# Patient Record
Sex: Female | Born: 1970 | Hispanic: Yes | Marital: Married | Smoking: Never smoker
Health system: Southern US, Community
[De-identification: ages and names within clinical notes are randomized; demographics above are authoritative.]

## PROBLEM LIST (undated history)

## (undated) ENCOUNTER — Emergency Department (HOSPITAL_COMMUNITY): Admission: EM | Payer: Self-pay | Source: Home / Self Care

## (undated) DIAGNOSIS — I10 Essential (primary) hypertension: Secondary | ICD-10-CM

## (undated) HISTORY — DX: Essential (primary) hypertension: I10

---

## 2006-02-18 ENCOUNTER — Ambulatory Visit: Payer: Self-pay | Admitting: Family Medicine

## 2006-02-18 ENCOUNTER — Encounter: Payer: Self-pay | Admitting: Family Medicine

## 2008-02-27 ENCOUNTER — Ambulatory Visit (HOSPITAL_COMMUNITY): Admission: RE | Admit: 2008-02-27 | Discharge: 2008-02-27 | Payer: Self-pay | Admitting: Specialist

## 2010-01-12 ENCOUNTER — Inpatient Hospital Stay (HOSPITAL_COMMUNITY): Admission: AD | Admit: 2010-01-12 | Discharge: 2010-01-12 | Payer: Self-pay | Admitting: Obstetrics & Gynecology

## 2011-02-02 LAB — POCT PREGNANCY, URINE: Preg Test, Ur: POSITIVE

## 2011-02-02 LAB — WET PREP, GENITAL: Clue Cells Wet Prep HPF POC: NONE SEEN

## 2011-02-02 LAB — CBC
Hemoglobin: 13 g/dL (ref 12.0–15.0)
WBC: 9.3 10*3/uL (ref 4.0–10.5)

## 2011-02-02 LAB — GC/CHLAMYDIA PROBE AMP, GENITAL: Chlamydia, DNA Probe: NEGATIVE

## 2011-02-02 LAB — HCG, QUANTITATIVE, PREGNANCY: hCG, Beta Chain, Quant, S: 42505 m[IU]/mL — ABNORMAL HIGH (ref <5)

## 2011-03-27 NOTE — Group Therapy Note (Signed)
NAME:  Courtney Daugherty, Courtney Daugherty NO.:  [id number]   MEDICAL RECORD NO.:  [id number]          PATIENT TYPE:  WOC   LOCATION:  [facility]                   FACILITY:  [facility]   PHYSICIAN:  Tinnie Gens, MD        DATE OF BIRTH:  1971-04-18   DATE OF SERVICE:                                    CLINIC NOTE   CHIEF COMPLAINT:  Yearly exam.   HISTORY OF PRESENT ILLNESS:  The patient is a 40 year old gravida 2, para 2  who is self referred for a yearly exam.  She is without significant  complaints today.  She currently has a 10-year IUD that was placed in  [place], and it has been in approximately 7 years, and she does not have  extremely painful periods.  The patient is complaining of about a 52-month  history of some lower abdominal pain.  She denies dysuria, constipation or  diarrhea.   PAST MEDICAL HISTORY:  Negative.   PAST SURGICAL HISTORY:  None.   MEDICATIONS:  None.   ALLERGIES:  None known.   OBSTETRICAL HISTORY:  She is a G2, P2.  STD x 2.   GYNECOLOGICAL HISTORY:  Menarche at age 23.  Cycles are monthly, every 28  days, lasts 8 days.  She has medium-to-light flow with no pain.  Her last  Pap was in 2005.  She has no history of an abnormal Pap.   FAMILY HISTORY:  Significant for coronary artery disease and hypertension.   SOCIAL HISTORY:  The patient does work.  She denies tobacco, alcohol or drug  use.   REVIEW OF SYSTEMS:  A 14-point review of systems was reviewed.  Please see  GYN history on the chart.  She does have some bruising and weight gain  recently.  Otherwise negative.   PHYSICAL EXAMINATION:  VITAL SIGNS:  Her weight is 126.8.  Other vital signs  are as noted on the chart.  GENERAL:  She is a well-developed, well-nourished Hispanic female in no  acute distress.  ABDOMEN:  Soft, nontender, nondistended.  GU:  Normal external female genitalia.  BUS was normal.  Vagina is pink and  rugated.  The cervix is visualized and without lesion.  IUD strings  are  visible at approximately 1.0 cm in length and white in nature.  There is a  large transformation zone seen.  The uterus is retroverted, nontender.  The  adnexa are without mass or tenderness.   IMPRESSION:  Normal yearly exam.   PLAN:  1.  Pap smear today.  2.  Follow up as needed in one year for a repeat Pap.  3.  Needs yearly mammogram starting at age 70.           ______________________________  Tinnie Gens, MD     TP/MEDQ  D:  02/18/2006  T:  02/18/2006  Job:  [id number]

## 2011-10-05 ENCOUNTER — Other Ambulatory Visit (HOSPITAL_COMMUNITY): Payer: Self-pay | Admitting: Geriatric Medicine

## 2011-10-05 DIAGNOSIS — Z1231 Encounter for screening mammogram for malignant neoplasm of breast: Secondary | ICD-10-CM

## 2011-11-06 ENCOUNTER — Ambulatory Visit (HOSPITAL_COMMUNITY): Payer: Self-pay

## 2011-11-11 ENCOUNTER — Ambulatory Visit (HOSPITAL_COMMUNITY)
Admission: RE | Admit: 2011-11-11 | Discharge: 2011-11-11 | Disposition: A | Discharge status: Home / Self Care | Payer: Self-pay | Source: Ambulatory Visit | Attending: Geriatric Medicine | Admitting: Geriatric Medicine

## 2011-11-11 DIAGNOSIS — Z1231 Encounter for screening mammogram for malignant neoplasm of breast: Secondary | ICD-10-CM | POA: Insufficient documentation

## 2012-12-26 ENCOUNTER — Other Ambulatory Visit (HOSPITAL_COMMUNITY): Payer: Self-pay | Admitting: Geriatric Medicine

## 2012-12-26 DIAGNOSIS — Z1231 Encounter for screening mammogram for malignant neoplasm of breast: Secondary | ICD-10-CM

## 2013-01-05 ENCOUNTER — Ambulatory Visit (HOSPITAL_COMMUNITY)
Admission: RE | Admit: 2013-01-05 | Discharge: 2013-01-05 | Disposition: A | Discharge status: Home / Self Care | Payer: Self-pay | Source: Ambulatory Visit | Attending: Geriatric Medicine | Admitting: Geriatric Medicine

## 2013-01-05 DIAGNOSIS — Z1231 Encounter for screening mammogram for malignant neoplasm of breast: Secondary | ICD-10-CM

## 2014-01-01 ENCOUNTER — Other Ambulatory Visit (HOSPITAL_COMMUNITY): Payer: Self-pay | Admitting: Geriatric Medicine

## 2014-01-01 DIAGNOSIS — Z1231 Encounter for screening mammogram for malignant neoplasm of breast: Secondary | ICD-10-CM

## 2014-01-10 ENCOUNTER — Ambulatory Visit (HOSPITAL_COMMUNITY)
Admission: RE | Admit: 2014-01-10 | Discharge: 2014-01-10 | Disposition: A | Discharge status: Home / Self Care | Payer: Self-pay | Source: Ambulatory Visit | Attending: Geriatric Medicine | Admitting: Geriatric Medicine

## 2014-01-10 DIAGNOSIS — Z1231 Encounter for screening mammogram for malignant neoplasm of breast: Secondary | ICD-10-CM

## 2015-01-03 ENCOUNTER — Other Ambulatory Visit (HOSPITAL_COMMUNITY): Payer: Self-pay | Admitting: Geriatric Medicine

## 2015-01-03 DIAGNOSIS — Z1231 Encounter for screening mammogram for malignant neoplasm of breast: Secondary | ICD-10-CM

## 2015-01-16 ENCOUNTER — Ambulatory Visit (HOSPITAL_COMMUNITY): Payer: Self-pay

## 2015-01-30 ENCOUNTER — Ambulatory Visit (HOSPITAL_COMMUNITY)
Admission: RE | Admit: 2015-01-30 | Discharge: 2015-01-30 | Disposition: A | Discharge status: Home / Self Care | Payer: Self-pay | Source: Ambulatory Visit | Attending: Geriatric Medicine | Admitting: Geriatric Medicine

## 2015-01-30 DIAGNOSIS — Z1231 Encounter for screening mammogram for malignant neoplasm of breast: Secondary | ICD-10-CM

## 2015-03-08 ENCOUNTER — Emergency Department (HOSPITAL_COMMUNITY)
Admission: EM | Admit: 2015-03-08 | Discharge: 2015-03-09 | Disposition: A | Discharge status: Home / Self Care | Payer: Medicaid Other | Attending: Emergency Medicine | Admitting: Emergency Medicine

## 2015-03-08 ENCOUNTER — Encounter (HOSPITAL_COMMUNITY): Payer: Self-pay | Admitting: Emergency Medicine

## 2015-03-08 ENCOUNTER — Emergency Department (HOSPITAL_COMMUNITY): Payer: Medicaid Other

## 2015-03-08 DIAGNOSIS — N10 Acute tubulo-interstitial nephritis: Secondary | ICD-10-CM | POA: Diagnosis not present

## 2015-03-08 DIAGNOSIS — R109 Unspecified abdominal pain: Secondary | ICD-10-CM | POA: Diagnosis present

## 2015-03-08 DIAGNOSIS — Z3202 Encounter for pregnancy test, result negative: Secondary | ICD-10-CM | POA: Insufficient documentation

## 2015-03-08 LAB — URINALYSIS, ROUTINE W REFLEX MICROSCOPIC
BILIRUBIN URINE: NEGATIVE
Glucose, UA: NEGATIVE mg/dL
KETONES UR: 40 mg/dL — AB
NITRITE: POSITIVE — AB
Protein, ur: 30 mg/dL — AB
SPECIFIC GRAVITY, URINE: 1.01 (ref 1.005–1.030)
Urobilinogen, UA: 1 mg/dL (ref 0.0–1.0)
pH: 6.5 (ref 5.0–8.0)

## 2015-03-08 LAB — COMPREHENSIVE METABOLIC PANEL
ALBUMIN: 3.2 g/dL — AB (ref 3.5–5.2)
ALK PHOS: 99 U/L (ref 39–117)
ALT: 18 U/L (ref 0–35)
ANION GAP: 9 (ref 5–15)
AST: 20 U/L (ref 0–37)
BILIRUBIN TOTAL: 0.7 mg/dL (ref 0.3–1.2)
BUN: 9 mg/dL (ref 6–23)
CALCIUM: 8.2 mg/dL — AB (ref 8.4–10.5)
CO2: 21 mmol/L (ref 19–32)
Chloride: 104 mmol/L (ref 96–112)
Creatinine, Ser: 0.71 mg/dL (ref 0.50–1.10)
GLUCOSE: 112 mg/dL — AB (ref 70–99)
POTASSIUM: 3.3 mmol/L — AB (ref 3.5–5.1)
SODIUM: 134 mmol/L — AB (ref 135–145)
Total Protein: 6.3 g/dL (ref 6.0–8.3)

## 2015-03-08 LAB — CBC WITH DIFFERENTIAL/PLATELET
Basophils Absolute: 0 10*3/uL (ref 0.0–0.1)
Basophils Relative: 0 % (ref 0–1)
EOS ABS: 0 10*3/uL (ref 0.0–0.7)
Eosinophils Relative: 0 % (ref 0–5)
HCT: 35.3 % — ABNORMAL LOW (ref 36.0–46.0)
Hemoglobin: 12.2 g/dL (ref 12.0–15.0)
LYMPHS PCT: 4 % — AB (ref 12–46)
Lymphs Abs: 0.9 10*3/uL (ref 0.7–4.0)
MCH: 31.6 pg (ref 26.0–34.0)
MCHC: 34.6 g/dL (ref 30.0–36.0)
MCV: 91.5 fL (ref 78.0–100.0)
MONO ABS: 2.2 10*3/uL — AB (ref 0.1–1.0)
Monocytes Relative: 11 % (ref 3–12)
NEUTROS PCT: 85 % — AB (ref 43–77)
Neutro Abs: 17.8 10*3/uL — ABNORMAL HIGH (ref 1.7–7.7)
PLATELETS: 191 10*3/uL (ref 150–400)
RBC: 3.86 MIL/uL — ABNORMAL LOW (ref 3.87–5.11)
RDW: 12.7 % (ref 11.5–15.5)
WBC: 20.9 10*3/uL — AB (ref 4.0–10.5)

## 2015-03-08 LAB — URINE MICROSCOPIC-ADD ON

## 2015-03-08 LAB — LIPASE, BLOOD: Lipase: 16 U/L (ref 11–59)

## 2015-03-08 LAB — POC URINE PREG, ED: Preg Test, Ur: NEGATIVE

## 2015-03-08 MED ORDER — ONDANSETRON HCL 4 MG/2ML IJ SOLN
4.0000 mg | Freq: Once | INTRAMUSCULAR | Status: AC
  Administered 2015-03-09: 4 mg via INTRAVENOUS
  Filled 2015-03-08: qty 2

## 2015-03-08 MED ORDER — SODIUM CHLORIDE 0.9 % IV BOLUS (SEPSIS)
1000.0000 mL | Freq: Once | INTRAVENOUS | Status: AC
  Administered 2015-03-08: 1000 mL via INTRAVENOUS

## 2015-03-08 MED ORDER — KETOROLAC TROMETHAMINE 30 MG/ML IJ SOLN
30.0000 mg | Freq: Once | INTRAMUSCULAR | Status: AC
  Administered 2015-03-08: 30 mg via INTRAVENOUS
  Filled 2015-03-08: qty 1

## 2015-03-08 MED ORDER — DEXTROSE 5 % IV SOLN
1.0000 g | Freq: Once | INTRAVENOUS | Status: AC
  Administered 2015-03-08: 1 g via INTRAVENOUS
  Filled 2015-03-08: qty 10

## 2015-03-08 MED ORDER — MORPHINE SULFATE 4 MG/ML IJ SOLN
4.0000 mg | Freq: Once | INTRAMUSCULAR | Status: AC
  Administered 2015-03-09: 4 mg via INTRAVENOUS
  Filled 2015-03-08: qty 1

## 2015-03-08 NOTE — ED Provider Notes (Signed)
CSN: [id number]     Arrival date & time 03/08/15  2138 History   First MD Initiated Contact with Patient 03/08/15 2251     Chief Complaint  Patient presents with  . Abdominal Pain  . Flank Pain  . Dysuria     (Consider location/radiation/quality/duration/timing/severity/associated sxs/prior Treatment) HPI Comments: Patient is a 44 year old female with no past medical history who presents with flank pain that started 3 days ago. The pain is located in the right flank and radiates around to her right abdomen. The pain is described as aching and severe. The pain started gradually and progressively worsened since the onset. No alleviating/aggravating factors. The patient has tried nothing for symptoms without relief. Associated symptoms include dysuria, nausea, vomiting, and diarrhea. Patient denies fever, headache, chest pain, SOB, dysuria, constipation, abnormal vaginal bleeding/discharge. No history of abdominal surgery.      History reviewed. No pertinent past medical history. History reviewed. No pertinent past surgical history. No family history on file. History  Substance Use Topics  . Smoking status: Never Smoker   . Smokeless tobacco: Not on file  . Alcohol Use: No   OB History    No data available     Review of Systems  Constitutional: Negative for fever, chills and fatigue.  HENT: Negative for trouble swallowing.   Eyes: Negative for visual disturbance.  Respiratory: Negative for shortness of breath.   Cardiovascular: Negative for chest pain and palpitations.  Gastrointestinal: Positive for vomiting and diarrhea. Negative for nausea and abdominal pain.  Genitourinary: Positive for dysuria and flank pain. Negative for difficulty urinating.  Musculoskeletal: Negative for arthralgias and neck pain.  Skin: Negative for color change.  Neurological: Negative for dizziness and weakness.  Psychiatric/Behavioral: Negative for dysphoric mood.      Allergies  Review of  patient's allergies indicates no known allergies.  Home Medications   Prior to Admission medications   Not on File   BP 116/70 mmHg  Pulse 102  Temp(Src) 99.4 F (37.4 C) (Oral)  Resp 20  Ht 5\' 3"  (1.6 m)  Wt 115 lb (52.164 kg)  BMI 20.38 kg/m2  SpO2 98%  LMP 02/14/2015 Physical Exam  Constitutional: She is oriented to person, place, and time. She appears well-developed and well-nourished. No distress.  HENT:  Head: Normocephalic and atraumatic.  Eyes: Conjunctivae and EOM are normal.  Neck: Normal range of motion.  Cardiovascular: Regular rhythm.  Exam reveals no gallop and no friction rub.   No murmur heard. tachycardic  Pulmonary/Chest: Effort normal and breath sounds normal. She has no wheezes. She has no rales. She exhibits no tenderness.  Abdominal: Soft. She exhibits no distension. There is tenderness. There is no rebound and no guarding.  Right sided abdominal tenderness to palpation. No focal tenderness or peritoneal signs.   Genitourinary:  Right CVA tenderness.   Musculoskeletal: Normal range of motion.  Neurological: She is alert and oriented to person, place, and time. Coordination normal.  Speech is goal-oriented. Moves limbs without ataxia.   Skin: Skin is warm and dry.  Psychiatric: She has a normal mood and affect. Her behavior is normal.  Nursing note and vitals reviewed.   ED Course  Procedures (including critical care time) Labs Review Labs Reviewed  CBC WITH DIFFERENTIAL/PLATELET - Abnormal; Notable for the following:    WBC 20.9 (*)    RBC 3.86 (*)    HCT 35.3 (*)    Neutrophils Relative % 85 (*)    Neutro Abs 17.8 (*)  Lymphocytes Relative 4 (*)    Monocytes Absolute 2.2 (*)    All other components within normal limits  COMPREHENSIVE METABOLIC PANEL - Abnormal; Notable for the following:    Sodium 134 (*)    Potassium 3.3 (*)    Glucose, Bld 112 (*)    Calcium 8.2 (*)    Albumin 3.2 (*)    All other components within normal limits   URINALYSIS, ROUTINE W REFLEX MICROSCOPIC - Abnormal; Notable for the following:    Color, Urine AMBER (*)    APPearance CLOUDY (*)    Hgb urine dipstick MODERATE (*)    Ketones, ur 40 (*)    Protein, ur 30 (*)    Nitrite POSITIVE (*)    Leukocytes, UA LARGE (*)    All other components within normal limits  URINE MICROSCOPIC-ADD ON - Abnormal; Notable for the following:    Squamous Epithelial / LPF MANY (*)    Bacteria, UA FEW (*)    All other components within normal limits  URINE CULTURE  LIPASE, BLOOD  POC URINE PREG, ED    Imaging Review Ct Abdomen Pelvis Wo Contrast  03/09/2015   CLINICAL DATA:  RIGHT flank pain, RIGHT abdominal and epigastric pain with dysuria. Vomiting and diarrhea beginning this week.  EXAM: CT ABDOMEN AND PELVIS WITHOUT CONTRAST  TECHNIQUE: Multidetector CT imaging of the abdomen and pelvis was performed following the standard protocol without IV contrast.  COMPARISON:  None.  FINDINGS: LUNG BASES: Included view of the lung bases are clear. The visualized heart and pericardium are unremarkable.  KIDNEYS/BLADDER: Kidneys are orthotopic, demonstrating normal size and morphology. Mild RIGHT hydronephrosis and perinephric stranding. No nephrolithiasis; limited assessment for renal masses on this nonenhanced examination. The unopacified ureters are normal in course and caliber. Urinary bladder is partially distended and unremarkable.  SOLID ORGANS: The liver, spleen, gallbladder, pancreas and adrenal glands are unremarkable for this non-contrast examination.  GASTROINTESTINAL TRACT: The stomach, small and large bowel are normal in course and caliber without inflammatory changes, the sensitivity may be decreased by lack of enteric contrast. Normal appendix.  PERITONEUM/RETROPERITONEUM: Aortoiliac vessels are normal in course and caliber. No lymphadenopathy by CT size criteria. Internal reproductive organs are normal; well-seated IUD. 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 Small amount of free fluid in the pelvis is likely physiologic. No intraperitoneal free air.  SOFT TISSUES/ OSSEOUS STRUCTURES: Nonsuspicious. Mild L5-S1 disc height loss, endplate spurring consistent with degenerative disc resulting in moderate LEFT neural foraminal narrowing.  IMPRESSION: Mild RIGHT hydronephrosis can be seen with urinary tract infection, urolithiasis.  Normal appendix.   Electronically Signed   By: Awilda Metro   On: 03/09/2015 00:21     EKG Interpretation None      MDM   Final diagnoses:  Pyelonephritis, acute    11:22 PM Labs show elevated WBC at 21 and urine infected. Patient tachycardic with a low grade temp of 99.4. Patient will have CT abdomen pelvis to rule out kidney stone.   12:48 AM Patient's vitals have improved. CT shows no kidney stone. Patient is well appearing and tolerating PO. Patient will be discharged with antibiotics, pain medication, and zofran. Patient instructed to return to the ED with worsening or concerning symptoms.    Emilia Beck, PA-C 03/09/15 8119  Marisa Severin, MD 03/09/15 [phone]

## 2015-03-08 NOTE — ED Notes (Signed)
Pt. reports right flank pain , right abdomina/epigastric pain with dysuria , emesis and diarrhea onset this week . No fever ore chills.

## 2015-03-09 MED ORDER — HYDROCODONE-ACETAMINOPHEN 5-325 MG PO TABS: 2.0000 | ORAL_TABLET | ORAL | Status: DC | PRN

## 2015-03-09 MED ORDER — SULFAMETHOXAZOLE-TRIMETHOPRIM 800-160 MG PO TABS: 1.0000 | ORAL_TABLET | Freq: Two times a day (BID) | ORAL | Status: AC

## 2015-03-09 MED ORDER — ONDANSETRON 4 MG PO TBDP
4.0000 mg | ORAL_TABLET | Freq: Three times a day (TID) | ORAL | Status: DC | PRN
Start: 2015-03-09 — End: 2021-10-10

## 2015-03-09 NOTE — Discharge Instructions (Signed)
Take Bactrim as directed until gone. Take Vicodin as needed for pain. Take zofran as needed for nausea. Refer to attached documents for more information.

## 2015-03-11 LAB — URINE CULTURE
Colony Count: >=100000
SPECIAL REQUESTS: NORMAL

## 2015-03-12 ENCOUNTER — Telehealth (HOSPITAL_BASED_OUTPATIENT_CLINIC_OR_DEPARTMENT_OTHER): Payer: Self-pay | Admitting: Emergency Medicine

## 2015-03-13 ENCOUNTER — Encounter (HOSPITAL_COMMUNITY): Payer: Self-pay | Admitting: *Deleted

## 2015-03-13 ENCOUNTER — Telehealth (HOSPITAL_BASED_OUTPATIENT_CLINIC_OR_DEPARTMENT_OTHER): Payer: Self-pay | Admitting: Emergency Medicine

## 2015-03-13 MED ORDER — NITROFURANTOIN MONOHYD MACRO 100 MG PO CAPS: 100.0000 mg | ORAL_CAPSULE | Freq: Two times a day (BID) | ORAL | Status: DC

## 2015-03-13 NOTE — ED Notes (Signed)
Per Dr Estell HarpinZammit and Denny PeonErin, [place], patient needs new RX and patient does not need to be seen.  Patient will need to follow up in a few days with PCP or [facility].

## 2015-03-13 NOTE — Telephone Encounter (Signed)
Post ED Visit - Positive Culture Follow-up: Successful Patient Follow-Up  Culture assessed and recommendations reviewed by: []  [name], Pharm.D., BCPS [x]  [name], Pharm.D., BCPS []  [name], Pharm.D., BCPS []  DanvilleMinh Pham, [address] Rainbow BoulevardPharm.D., BCPS, AAHIVP []  [name], Pharm.D., BCPS, AAHIVP []  [name], Pharm.D. []  [name], Pharm.D.  Positive urine culture E. coli  []  Patient discharged without antimicrobial prescription and treatment is now indicated [x]  Organism is resistant to prescribed ED discharge antimicrobial []  Patient with positive blood cultures  Changes discussed with ED provider:[name] PA New antibiotic prescription need to return to ED for IV antibiotics Called to n/a  Contacted patient, date 03/13/15 @ 1445   [name], [name] 03/13/2015, 2:51 PM

## 2015-03-13 NOTE — ED Notes (Signed)
Patient will be dismissed, visit not needed.

## 2015-03-13 NOTE — ED Notes (Signed)
The pt was here Friday night and diagnosed with a uti.   She is on antibiotics but they were called  And told the antibiotics she was placed on were not the right ones and she needed to come back

## 2015-03-13 NOTE — ED Provider Notes (Signed)
Pt returned back to ED as [facility] explaining her urine culture resulted, indicated she needed a change in her antibiotics. Reviewed urine culture with Dr. Estell HarpinZammit.  Pt is young and otherwise healthy. Afebrile. Do not believe pt needs IV antibiotics at this time. Will write a prescription for nitrofurantoin.  Advised to f/u in 3-4 days with PCP or return to ED if needed if symptoms not improving.   Junius Finnerrin O'Malley, PA-C 03/13/15 2156  Bethann BerkshireJoseph Zammit, MD 03/14/15 [phone]

## 2015-09-02 ENCOUNTER — Other Ambulatory Visit: Payer: Self-pay | Admitting: Neurosurgery

## 2015-09-02 DIAGNOSIS — M5126 Other intervertebral disc displacement, lumbar region: Secondary | ICD-10-CM

## 2016-01-27 ENCOUNTER — Other Ambulatory Visit: Payer: Self-pay | Admitting: Emergency Medicine

## 2016-01-27 ENCOUNTER — Other Ambulatory Visit: Payer: Self-pay | Admitting: Geriatric Medicine

## 2016-01-27 ENCOUNTER — Other Ambulatory Visit: Payer: Self-pay | Admitting: Obstetrics & Gynecology

## 2016-01-27 DIAGNOSIS — Z1231 Encounter for screening mammogram for malignant neoplasm of breast: Secondary | ICD-10-CM

## 2016-02-05 ENCOUNTER — Ambulatory Visit
Admission: RE | Admit: 2016-02-05 | Discharge: 2016-02-05 | Disposition: A | Discharge status: Home / Self Care | Payer: No Typology Code available for payment source | Source: Ambulatory Visit | Attending: Geriatric Medicine | Admitting: Geriatric Medicine

## 2016-02-05 DIAGNOSIS — Z1231 Encounter for screening mammogram for malignant neoplasm of breast: Secondary | ICD-10-CM

## 2017-01-27 ENCOUNTER — Other Ambulatory Visit: Payer: Self-pay | Admitting: Internal Medicine

## 2017-01-27 DIAGNOSIS — Z1231 Encounter for screening mammogram for malignant neoplasm of breast: Secondary | ICD-10-CM

## 2017-02-24 ENCOUNTER — Ambulatory Visit
Admission: RE | Admit: 2017-02-24 | Discharge: 2017-02-24 | Disposition: A | Discharge status: Home / Self Care | Payer: No Typology Code available for payment source | Source: Ambulatory Visit | Attending: Internal Medicine | Admitting: Internal Medicine

## 2017-02-24 DIAGNOSIS — Z1231 Encounter for screening mammogram for malignant neoplasm of breast: Secondary | ICD-10-CM

## 2018-02-15 ENCOUNTER — Other Ambulatory Visit: Payer: Self-pay | Admitting: Physician Assistant

## 2018-02-15 DIAGNOSIS — Z1231 Encounter for screening mammogram for malignant neoplasm of breast: Secondary | ICD-10-CM

## 2018-02-21 ENCOUNTER — Other Ambulatory Visit: Payer: Self-pay

## 2018-02-23 LAB — CYTOLOGY - PAP: Diagnosis: NEGATIVE

## 2018-03-16 ENCOUNTER — Ambulatory Visit
Admission: RE | Admit: 2018-03-16 | Discharge: 2018-03-16 | Disposition: A | Discharge status: Home / Self Care | Payer: No Typology Code available for payment source | Source: Ambulatory Visit | Attending: Physician Assistant | Admitting: Physician Assistant

## 2018-03-16 DIAGNOSIS — Z1231 Encounter for screening mammogram for malignant neoplasm of breast: Secondary | ICD-10-CM

## 2019-05-22 ENCOUNTER — Other Ambulatory Visit: Payer: Self-pay | Admitting: Internal Medicine

## 2019-05-22 DIAGNOSIS — Z1231 Encounter for screening mammogram for malignant neoplasm of breast: Secondary | ICD-10-CM

## 2019-07-11 ENCOUNTER — Other Ambulatory Visit: Payer: Self-pay

## 2019-07-11 ENCOUNTER — Ambulatory Visit
Admission: RE | Admit: 2019-07-11 | Discharge: 2019-07-11 | Disposition: A | Discharge status: Home / Self Care | Payer: No Typology Code available for payment source | Source: Ambulatory Visit | Attending: Internal Medicine | Admitting: Internal Medicine

## 2019-07-11 DIAGNOSIS — Z1231 Encounter for screening mammogram for malignant neoplasm of breast: Secondary | ICD-10-CM

## 2019-08-24 ENCOUNTER — Ambulatory Visit (HOSPITAL_COMMUNITY): Payer: No Typology Code available for payment source

## 2020-07-30 ENCOUNTER — Other Ambulatory Visit: Payer: Self-pay | Admitting: Obstetrics and Gynecology

## 2020-07-30 DIAGNOSIS — Z1231 Encounter for screening mammogram for malignant neoplasm of breast: Secondary | ICD-10-CM

## 2020-08-27 ENCOUNTER — Ambulatory Visit: Payer: Self-pay | Admitting: *Deleted

## 2020-08-27 ENCOUNTER — Other Ambulatory Visit: Payer: Self-pay

## 2020-08-27 ENCOUNTER — Ambulatory Visit
Admission: RE | Admit: 2020-08-27 | Discharge: 2020-08-27 | Disposition: A | Discharge status: Home / Self Care | Payer: No Typology Code available for payment source | Source: Ambulatory Visit | Attending: Obstetrics and Gynecology | Admitting: Obstetrics and Gynecology

## 2020-08-27 VITALS — BP 134/84 | Temp 97.1°F | Wt 124.1 lb

## 2020-08-27 DIAGNOSIS — Z1231 Encounter for screening mammogram for malignant neoplasm of breast: Secondary | ICD-10-CM

## 2020-08-27 DIAGNOSIS — Z1239 Encounter for other screening for malignant neoplasm of breast: Secondary | ICD-10-CM

## 2020-08-27 NOTE — Patient Instructions (Addendum)
Explained breast self awareness with [name]. Patient did not need a Pap smear today due to last Pap smear was 02/21/2018. Let her know BCCCP will cover Pap smears every 3 years unless has a history of abnormal Pap smears. Referred patient to the [facility] of [facility] for a screening mammogram on the mobile unit. Appointment scheduled Tuesday, August 27, 2020 at 1530. Patient escorted to mobile unit following BCCCP appointment for her screening mammogram. Let patient know the Breast Center will follow up with her within the next couple weeks with results of her mammogram by letter or phone. [name] verbalized understanding.  Kenetha Cozza, Kathaleen Maser, RN 1:47 PM

## 2020-08-27 NOTE — Progress Notes (Addendum)
Ms. Courtney Daugherty is a 49 y.o. female who presents to [facility] clinic today with no complaints.    Pap Smear: Pap smear not completed today. Last Pap smear was 02/21/2018 at free cervical cancer screening clinic sponsored by [facility] and was normal. Per patient has no history of an abnormal Pap smear. Last Pap smear result is available in Epic.   Physical exam: Breasts Breasts symmetrical. No skin abnormalities bilateral breasts. No nipple retraction bilateral breasts. No nipple discharge bilateral breasts. No lymphadenopathy. No lumps palpated bilateral breasts. No complaints of pain or tenderness on exam.       Pelvic/Bimanual Pap is not indicated today per BCCCP guidelines.   Smoking History: Patient has never smoked.   Patient Navigation: Patient education provided. Access to services provided for patient through [place] program. Spanish interpreter Natale Lay from [facility] provided.    Breast and Cervical Cancer Risk Assessment: Patient does not have family history of breast cancer, known genetic mutations, or radiation treatment to the chest before age 60. Patient does not have history of cervical dysplasia, immunocompromised, or DES exposure in-utero.  Risk Assessment    Risk Scores      08/27/2020   Last edited by: Narda Rutherford, LPN   5-year risk: 0.6 %   Lifetime risk: 5.8 %          A: BCCCP exam without pap smear No complaints.  P: Referred patient to the [facility] for a screening mammogram on the mobile unit. Appointment scheduled Tuesday, August 27, 2020 at 1530.  [name], RN 08/27/2020 1:47 PM

## 2020-09-02 ENCOUNTER — Other Ambulatory Visit: Payer: Self-pay | Admitting: Obstetrics and Gynecology

## 2020-09-02 DIAGNOSIS — R928 Other abnormal and inconclusive findings on diagnostic imaging of breast: Secondary | ICD-10-CM

## 2020-09-17 ENCOUNTER — Ambulatory Visit: Payer: No Typology Code available for payment source

## 2020-09-17 ENCOUNTER — Other Ambulatory Visit: Payer: Self-pay

## 2020-09-17 ENCOUNTER — Ambulatory Visit
Admission: RE | Admit: 2020-09-17 | Discharge: 2020-09-17 | Disposition: A | Discharge status: Home / Self Care | Payer: No Typology Code available for payment source | Source: Ambulatory Visit | Attending: Obstetrics and Gynecology | Admitting: Obstetrics and Gynecology

## 2020-09-17 DIAGNOSIS — R928 Other abnormal and inconclusive findings on diagnostic imaging of breast: Secondary | ICD-10-CM

## 2021-01-03 ENCOUNTER — Other Ambulatory Visit: Payer: Self-pay

## 2021-01-03 DIAGNOSIS — R928 Other abnormal and inconclusive findings on diagnostic imaging of breast: Secondary | ICD-10-CM

## 2021-01-03 NOTE — Addendum Note (Signed)
Addended by: Narda Rutherford on: 01/03/2021 11:49 AM   Modules accepted: Orders

## 2021-01-08 ENCOUNTER — Other Ambulatory Visit: Payer: Self-pay | Admitting: *Deleted

## 2021-01-08 DIAGNOSIS — N6310 Unspecified lump in the right breast, unspecified quadrant: Secondary | ICD-10-CM

## 2021-01-08 NOTE — Addendum Note (Signed)
Addended by: Narda Rutherford on: 01/08/2021 08:10 AM   Modules accepted: Orders

## 2021-01-16 ENCOUNTER — Ambulatory Visit
Admission: RE | Admit: 2021-01-16 | Discharge: 2021-01-16 | Disposition: A | Discharge status: Home / Self Care | Payer: No Typology Code available for payment source | Source: Ambulatory Visit | Attending: Obstetrics and Gynecology | Admitting: Obstetrics and Gynecology

## 2021-01-16 ENCOUNTER — Other Ambulatory Visit: Payer: Self-pay | Admitting: Obstetrics and Gynecology

## 2021-01-16 ENCOUNTER — Other Ambulatory Visit: Payer: Self-pay

## 2021-01-16 ENCOUNTER — Ambulatory Visit: Payer: Self-pay | Admitting: *Deleted

## 2021-01-16 VITALS — BP 140/82 | Wt 125.7 lb

## 2021-01-16 DIAGNOSIS — N6310 Unspecified lump in the right breast, unspecified quadrant: Secondary | ICD-10-CM

## 2021-01-16 DIAGNOSIS — M79621 Pain in right upper arm: Secondary | ICD-10-CM

## 2021-01-16 DIAGNOSIS — Z1239 Encounter for other screening for malignant neoplasm of breast: Secondary | ICD-10-CM

## 2021-01-16 NOTE — Progress Notes (Signed)
Courtney Daugherty is a 50 y.o. female who presents to [facility] clinic today with complaint of right axillary lump since 01/05/2021 that is painful when touched. Patient states the pain radiates to her upper arm and shoulder. Patient rates the pain at a 5 out of 10.    Pap Smear: Pap smear not completed today. [facility]Per patient has no history of an abnormal Pap smear. Last Pap smear resultis available in Epic.   Physical exam: Breasts Breasts symmetrical. No skin abnormalities bilateral breasts. No nipple retraction bilateral breasts. No nipple discharge bilateral breasts. No lymphadenopathy left axilla. Right axillary lymphadenopathy. No lumps palpated bilateral breasts. Unable to palpate a lump in right axilla. Patients area of concern of lump is right upper arm. Complaints of tenderness when palpated right axilla on exam.      MS DIGITAL SCREENING BILATERAL  Result Date: 02/24/2017 CLINICAL DATA:  Screening. EXAM: DIGITAL SCREENING BILATERAL MAMMOGRAM WITH CAD COMPARISON:  Previous exam(s). ACR Breast Density Category c: The breast tissue is heterogeneously dense, which may obscure small masses. FINDINGS: There are no findings suspicious for malignancy. Images were processed with CAD. IMPRESSION: No mammographic evidence of malignancy. A result letter of this screening mammogram will be mailed directly to the patient. RECOMMENDATION: Screening mammogram in one year. (Code:SM-B-01Y) BI-RADS CATEGORY  1: Negative. Electronically Signed   By: Elberta Fortis M.D.   On: 02/24/2017 13:13   MS DIGITAL SCREENING BILATERAL  Result Date: 02/05/2016 CLINICAL DATA:  Screening. EXAM: DIGITAL SCREENING BILATERAL MAMMOGRAM WITH CAD COMPARISON:  Previous exam(s). ACR Breast Density Category c: The breast tissue is heterogeneously dense, which may obscure small masses. FINDINGS: There are no  findings suspicious for malignancy. Images were processed with CAD. IMPRESSION: No mammographic evidence of malignancy. A result letter of this screening mammogram will be mailed directly to the patient. RECOMMENDATION: Screening mammogram in one year. (Code:SM-B-01Y) BI-RADS CATEGORY  1: Negative. Electronically Signed   By: Hulan Saas M.D.   On: 02/06/2016 07:51   MS DIGITAL SCREENING TOMO BILATERAL  Result Date: 08/30/2020 CLINICAL DATA:  Screening. EXAM: DIGITAL SCREENING BILATERAL MAMMOGRAM WITH TOMO AND CAD COMPARISON:  Previous exam(s). ACR Breast Density Category c: The breast tissue is heterogeneously dense, which may obscure small masses. FINDINGS: In the right breast, a possible asymmetry warrants further evaluation. In the left breast, no findings suspicious for malignancy. Images were processed with CAD. IMPRESSION: Further evaluation is suggested for possible asymmetry in the right breast. RECOMMENDATION: Diagnostic mammogram and possibly ultrasound of the right breast. (Code:FI-R-44M) The patient will be contacted regarding the findings, and additional imaging will be scheduled. BI-RADS CATEGORY  0: Incomplete. Need additional imaging evaluation and/or prior mammograms for comparison. Electronically Signed   By: Norva Pavlov M.D.   On: 08/30/2020 13:29   MS DIGITAL SCREENING TOMO BILATERAL  Result Date: 07/12/2019 CLINICAL DATA:  Screening. EXAM: DIGITAL SCREENING BILATERAL MAMMOGRAM WITH TOMO AND CAD COMPARISON:  Previous exam(s). ACR Breast Density Category c: The breast tissue is heterogeneously dense, which may obscure small masses. FINDINGS: There are no findings suspicious for malignancy. Images were processed with CAD. IMPRESSION: No mammographic evidence of malignancy. A result letter of this screening mammogram will be mailed directly to the patient. RECOMMENDATION: Screening mammogram in one year. (Code:SM-B-01Y) BI-RADS CATEGORY  1: Negative. Electronically Signed   By:  Britta Mccreedy M.D.   On: 07/12/2019 11:34   MS DIGITAL  SCREENING TOMO BILATERAL  Result Date: 03/16/2018 CLINICAL DATA:  Screening. EXAM: DIGITAL SCREENING BILATERAL MAMMOGRAM WITH TOMO AND CAD COMPARISON:  Previous exam(s). ACR Breast Density Category c: The breast tissue is heterogeneously dense, which may obscure small masses. FINDINGS: There are no findings suspicious for malignancy. Images were processed with CAD. IMPRESSION: No mammographic evidence of malignancy. A result letter of this screening mammogram will be mailed directly to the patient. RECOMMENDATION: Screening mammogram in one year. (Code:SM-B-01Y) BI-RADS CATEGORY  1: Negative. Electronically Signed   By: Amie Portland M.D.   On: 03/16/2018 14:36   MS DIGITAL DIAG TOMO UNI RIGHT  Result Date: 09/17/2020 CLINICAL DATA:  Patient returns after screening study for evaluation of possible RIGHT breast asymmetry. EXAM: DIGITAL DIAGNOSTIC UNILATERAL RIGHT MAMMOGRAM WITH TOMO AND CAD COMPARISON:  Previous exam(s). ACR Breast Density Category c: The breast tissue is heterogeneously dense, which may obscure small masses. FINDINGS: Additional 2-D and 3-D images are performed. These views show no persistent mass or asymmetry in the superior or mid portion of the RIGHT breast. Mammographic images were processed with CAD. IMPRESSION: No mammographic evidence for malignancy. RECOMMENDATION: Screening mammogram in one year.(Code:SM-B-01Y) I have discussed the findings and recommendations with the patient and mother. If applicable, a reminder letter will be sent to the patient regarding the next appointment. BI-RADS CATEGORY  1: Negative. Electronically Signed   By: Norva Pavlov M.D.   On: 09/17/2020 09:18   Pelvic/Bimanual Pap is not indicated today per BCCCP guidelines.   Smoking History: Patient has never smoked.    Patient Navigation: Patient education provided. Access to services provided for patient through [place] program. Spanish  interpreter Alene Mires from [facility] provided.   Colorectal Cancer Screening: Per patient has never had colonoscopy completed. No complaints today.    Breast and Cervical Cancer Risk Assessment: Patient does not have family history of breast cancer, known genetic mutations, or radiation treatment to the chest before age 80. Patient does not have history of cervical dysplasia, immunocompromised, or DES exposure in-utero.  Risk Assessment    Risk Scores      01/16/2021 08/27/2020   Last edited by: Narda Rutherford, LPN McGill, Sherie Demetrius Charity, LPN   5-year risk: 0.6 % 0.6 %   Lifetime risk: 5.8 % 5.8 %          A: BCCCP exam without pap smear Complaint of right axillary lump and pain.  P: Referred patient to the [facility] of [facility] for a right breast diagnostic mammogram. Appointment scheduled Thursday, January 16, 2021 at 1240.  [name], RN 01/16/2021 11:11 AM

## 2021-01-16 NOTE — Patient Instructions (Signed)
Explained breast self awareness with [name]. Patient did not need a Pap smear today due to last Pap smear was 02/21/2018. Let her know BCCCP will cover Pap smears every 3 years unless has a history of abnormal Pap smears. Patient scheduled for Pap smear at [facility] Tuesday, Mar 18, 2021 at 0830. Referred patient to the [facility] of [facility] for a right breast diagnostic mammogram. Appointment scheduled Thursday, January 16, 2021 at 1240. Patient aware of appointments and will be there. [name] verbalized understanding.  Linell Meldrum, Kathaleen Maser, RN 11:12 AM

## 2021-03-18 ENCOUNTER — Other Ambulatory Visit: Payer: Self-pay

## 2021-03-18 ENCOUNTER — Other Ambulatory Visit: Payer: Self-pay | Admitting: *Deleted

## 2021-03-18 DIAGNOSIS — Z124 Encounter for screening for malignant neoplasm of cervix: Secondary | ICD-10-CM

## 2021-03-18 NOTE — Progress Notes (Addendum)
Patient: Courtney Daugherty           Date of Birth: [birth year]           MRN: [id number] Visit Date: 03/18/2021 PCP: Rennis Harding, FNP  Cervical Cancer Screening Do you smoke?: No Have you ever had or been told you have an allergy to latex products?: No Marital status: Married Date of last pap smear: 2-5 yrs ago (02/21/18) Date of last menstrual period: 02/16/21 Number of pregnancies: 3 Number of births: 3 Have you ever had any of the following? Hysterectomy: No Tubal ligation (tubes tied): No Abnormal bleeding: No Abnormal pap smear: No Venereal warts: No A sex partner with venereal warts: No A high risk* sex partner: No  Cervical Exam  Abnormal Observations: Normal Exam. IUD strings visualized. Recommendations: Last Pap smear was 02/21/2018 at free cervical cancer screening clinic sponsored by [facility] and was normal. Per patient has no history of an abnormal Pap smear. Last Pap smear result is available in Epic. If today's Pap smear normal and HPV negative next Pap smear is due in 5 years.      Used Spanish interpreter [employer] from [place].  Patient's History There are no problems to display for this patient.  No past medical history on file.  Family History  Problem Relation Age of Onset  . Hypertension Mother   . Heart attack Father     Social History   Occupational History  . Not on file  Tobacco Use  . Smoking status: Never Smoker  . Smokeless tobacco: Never Used  Vaping Use  . Vaping Use: Never used  Substance and Sexual Activity  . Alcohol use: No  . Drug use: No  . Sexual activity: Yes    Birth control/protection: I.U.D.

## 2021-03-20 LAB — CYTOLOGY - PAP
Comment: NEGATIVE
Diagnosis: UNDETERMINED — AB
High risk HPV: NEGATIVE

## 2021-03-24 ENCOUNTER — Telehealth: Payer: Self-pay

## 2021-03-24 NOTE — Telephone Encounter (Signed)
Attempted to call patient via Spanish interpreter Natale Lay about pap smear results. Left name and number for patient to call back.

## 2021-03-25 ENCOUNTER — Telehealth: Payer: Self-pay

## 2021-03-25 NOTE — Telephone Encounter (Signed)
Called patient via Spanish interpreter Courtney Daugherty to give pap smear results. Informed patient that her pap showed (ASCUS, - HPV). Based on this result next pap smear will be due in 1 year. Patient voiced understanding.

## 2021-04-09 ENCOUNTER — Other Ambulatory Visit: Payer: Self-pay

## 2021-04-09 ENCOUNTER — Ambulatory Visit: Payer: No Typology Code available for payment source

## 2021-04-09 ENCOUNTER — Inpatient Hospital Stay: Payer: Self-pay | Attending: Obstetrics and Gynecology | Admitting: *Deleted

## 2021-04-09 VITALS — BP 138/82 | Ht 63.0 in | Wt 127.8 lb

## 2021-04-09 DIAGNOSIS — Z0000 Encounter for general adult medical examination without abnormal findings: Secondary | ICD-10-CM

## 2021-04-09 NOTE — Progress Notes (Signed)
Wisewoman initial screening   Interpreter- Natale Lay, [employer]   Clinical Measurement: There were no vitals filed for this visit. Fasting Labs Drawn Today, will review with patient when they result.   Medical History:  Patient states that she does not know if she has high cholesterol or high blood pressure. Patient states that she does not know if she has diabetes.  Medications:  Patient states that she does not take medication to lower cholesterol, blood pressure or blood sugar.  Patient does not take an aspirin a day to help prevent a heart attack or stroke.   Blood pressure, self measurement: Patient states that she does measure blood pressure from home. She checks her blood pressure weekly. She shares her readings with a health care provider: yes.   Nutrition: Patient states that on average she eats 1 cups of fruit and 1 cups of vegetables per day. Patient states that she does eat fish at least 2 times per week. Patient eats less than half servings of whole grains. Patient drinks less than 36 ounces of beverages with added sugar weekly: yes. Patient is currently watching sodium or salt intake: yes. In the past 7 days patient has consumed drinks containing alcohol on 0 days. On a day that patient consumes drinks containing alcohol on average 0 drinks are consumed.      Physical activity:  Patient states that she gets 180 minutes of moderate and 180 minutes of vigorous physical activity each week.  Smoking status:  Patient states that she has has never smoked .   Quality of life:  Over the past 2 weeks patient states that she had little interest or pleasure in doing things: not at all. She has been feeling down, depressed or hopeless:not at all.    Risk reduction and counseling:    Health Coaching: Explained to patient about the daily recommendation for fruits and vegetables. Patient currently consumes tilapia twice a week. Gave suggestions for other heart healthy fish that she could try  adding in diet such as: salmon, tuna, mackerel, sardines, sea bass or trout. Patient currently consumes oatmeal a couple of times a week. Gave suggestions for other whole grains such as: brown rice, whole wheat bread or pasta or whole grain cereals. Encouraged patient to continue watching the amount of salt that she consumes since her BP has been borderline hypertension. Patient has good exercise routine established. Encouraged patient to continue with daily exercise.    Navigation:  I will notify patient of lab results.  Patient is aware of 2 more health coaching sessions and a follow up.  Time: 25 minutes

## 2021-04-10 LAB — HEMOGLOBIN A1C
Est. average glucose Bld gHb Est-mCnc: 100 mg/dL
Hgb A1c MFr Bld: 5.1 % (ref 4.8–5.6)

## 2021-04-10 LAB — LIPID PANEL
Chol/HDL Ratio: 2.8 ratio (ref 0.0–4.4)
Cholesterol, Total: 172 mg/dL (ref 100–199)
HDL: 62 mg/dL (ref >39)
LDL Chol Calc (NIH): 94 mg/dL (ref 0–99)
Triglycerides: 87 mg/dL (ref 0–149)
VLDL Cholesterol Cal: 16 mg/dL (ref 5–40)

## 2021-04-10 LAB — GLUCOSE, RANDOM: Glucose: 88 mg/dL (ref 65–99)

## 2021-06-06 ENCOUNTER — Telehealth: Payer: Self-pay

## 2021-06-06 NOTE — Telephone Encounter (Signed)
Pt called to about a mass under her right armpit. Already has a PCP, recommended to reach out to PCP to get check

## 2021-08-12 ENCOUNTER — Other Ambulatory Visit: Payer: Self-pay | Admitting: Obstetrics and Gynecology

## 2021-08-12 DIAGNOSIS — Z1231 Encounter for screening mammogram for malignant neoplasm of breast: Secondary | ICD-10-CM

## 2021-09-25 ENCOUNTER — Ambulatory Visit: Payer: Self-pay

## 2021-09-25 ENCOUNTER — Telehealth: Payer: Self-pay

## 2021-09-25 ENCOUNTER — Other Ambulatory Visit: Payer: Self-pay

## 2021-09-25 ENCOUNTER — Ambulatory Visit
Admission: RE | Admit: 2021-09-25 | Discharge: 2021-09-25 | Disposition: A | Discharge status: Home / Self Care | Payer: No Typology Code available for payment source | Source: Ambulatory Visit | Attending: Family Medicine | Admitting: Family Medicine

## 2021-09-25 ENCOUNTER — Ambulatory Visit: Payer: Self-pay | Admitting: *Deleted

## 2021-09-25 VITALS — BP 150/96 | Wt 123.4 lb

## 2021-09-25 DIAGNOSIS — Z1211 Encounter for screening for malignant neoplasm of colon: Secondary | ICD-10-CM

## 2021-09-25 DIAGNOSIS — Z1239 Encounter for other screening for malignant neoplasm of breast: Secondary | ICD-10-CM

## 2021-09-25 DIAGNOSIS — Z1231 Encounter for screening mammogram for malignant neoplasm of breast: Secondary | ICD-10-CM

## 2021-09-25 NOTE — Progress Notes (Signed)
Ms. Courtney Daugherty is a 50 y.o. female who presents to [facility] clinic today with no complaints.    Pap Smear: Pap smear not completed today. Last Pap smear was 03/18/2021 at the free cervical cancer screening clinic and was abnormal - ASCUS with negative HPV . Per patient has no history of an abnormal Pap smear prior to her most recent Pap smear. Last Pap smear result is available in Epic.   Physical exam: Breasts Breasts symmetrical. No skin abnormalities bilateral breasts. No nipple retraction bilateral breasts. No nipple discharge bilateral breasts. No lymphadenopathy. No lumps palpated bilateral breasts. No complaints of pain or tenderness on exam.  MS DIGITAL SCREENING BILATERAL  Result Date: 02/24/2017 CLINICAL DATA:  Screening. EXAM: DIGITAL SCREENING BILATERAL MAMMOGRAM WITH CAD COMPARISON:  Previous exam(s). ACR Breast Density Category c: The breast tissue is heterogeneously dense, which may obscure small masses. FINDINGS: There are no findings suspicious for malignancy. Images were processed with CAD. IMPRESSION: No mammographic evidence of malignancy. A result letter of this screening mammogram will be mailed directly to the patient. RECOMMENDATION: Screening mammogram in one year. (Code:SM-B-01Y) BI-RADS CATEGORY  1: Negative. Electronically Signed   By: Elberta Fortis M.D.   On: 02/24/2017 13:13   MS DIGITAL SCREENING TOMO BILATERAL  Result Date: 08/30/2020 CLINICAL DATA:  Screening. EXAM: DIGITAL SCREENING BILATERAL MAMMOGRAM WITH TOMO AND CAD COMPARISON:  Previous exam(s). ACR Breast Density Category c: The breast tissue is heterogeneously dense, which may obscure small masses. FINDINGS: In the right breast, a possible asymmetry warrants further evaluation. In the left breast, no findings suspicious for malignancy. Images were processed with CAD. IMPRESSION: Further evaluation is suggested for possible asymmetry in the right breast. RECOMMENDATION: Diagnostic mammogram and  possibly ultrasound of the right breast. (Code:FI-R-12M) The patient will be contacted regarding the findings, and additional imaging will be scheduled. BI-RADS CATEGORY  0: Incomplete. Need additional imaging evaluation and/or prior mammograms for comparison. Electronically Signed   By: Norva Pavlov M.D.   On: 08/30/2020 13:29   MS DIGITAL SCREENING TOMO BILATERAL  Result Date: 07/12/2019 CLINICAL DATA:  Screening. EXAM: DIGITAL SCREENING BILATERAL MAMMOGRAM WITH TOMO AND CAD COMPARISON:  Previous exam(s). ACR Breast Density Category c: The breast tissue is heterogeneously dense, which may obscure small masses. FINDINGS: There are no findings suspicious for malignancy. Images were processed with CAD. IMPRESSION: No mammographic evidence of malignancy. A result letter of this screening mammogram will be mailed directly to the patient. RECOMMENDATION: Screening mammogram in one year. (Code:SM-B-01Y) BI-RADS CATEGORY  1: Negative. Electronically Signed   By: Britta Mccreedy M.D.   On: 07/12/2019 11:34   MS DIGITAL SCREENING TOMO BILATERAL  Result Date: 03/16/2018 CLINICAL DATA:  Screening. EXAM: DIGITAL SCREENING BILATERAL MAMMOGRAM WITH TOMO AND CAD COMPARISON:  Previous exam(s). ACR Breast Density Category c: The breast tissue is heterogeneously dense, which may obscure small masses. FINDINGS: There are no findings suspicious for malignancy. Images were processed with CAD. IMPRESSION: No mammographic evidence of malignancy. A result letter of this screening mammogram will be mailed directly to the patient. RECOMMENDATION: Screening mammogram in one year. (Code:SM-B-01Y) BI-RADS CATEGORY  1: Negative. Electronically Signed   By: Amie Portland M.D.   On: 03/16/2018 14:36   MS DIGITAL DIAG TOMO UNI RIGHT  Result Date: 09/17/2020 CLINICAL DATA:  Patient returns after screening study for evaluation of possible RIGHT breast asymmetry. EXAM: DIGITAL DIAGNOSTIC UNILATERAL RIGHT MAMMOGRAM WITH TOMO AND CAD  COMPARISON:  Previous exam(s). ACR Breast Density Category c: The  breast tissue is heterogeneously dense, which may obscure small masses. FINDINGS: Additional 2-D and 3-D images are performed. These views show no persistent mass or asymmetry in the superior or mid portion of the RIGHT breast. Mammographic images were processed with CAD. IMPRESSION: No mammographic evidence for malignancy. RECOMMENDATION: Screening mammogram in one year.(Code:SM-B-01Y) I have discussed the findings and recommendations with the patient and mother. If applicable, a reminder letter will be sent to the patient regarding the next appointment. BI-RADS CATEGORY  1: Negative. Electronically Signed   By: Norva Pavlov M.D.   On: 09/17/2020 09:18        Pelvic/Bimanual Pap is not indicated today per BCCCP guidelines.   Smoking History: Patient has never smoked.   Patient Navigation: Patient education provided. Access to services provided for patient through [place] program. Spanish interpreter Natale Lay from [facility] provided.   Colorectal Cancer Screening: Per patient has never had colonoscopy completed. No complaints today.    Breast and Cervical Cancer Risk Assessment: Patient does not have family history of breast cancer, known genetic mutations, or radiation treatment to the chest before age 64. Patient does not have history of cervical dysplasia, immunocompromised, or DES exposure in-utero.  Risk Assessment     Risk Scores       09/25/2021 01/16/2021   Last edited by: Courtney Daugherty, CMA [place], Courtney Demetrius Charity, LPN   5-year risk: 0.6 % 0.6 %   Lifetime risk: 5.8 % 5.8 %            A: BCCCP exam without pap smear No complaints.  P: Referred patient to the [facility] for a screening mammogram on mobile unit. Appointment scheduled Thursday, September 25, 2021 at 1520.  Courtney Heidelberg, RN 09/25/2021 2:37 PM

## 2021-09-25 NOTE — Telephone Encounter (Signed)
Health coaching 2   interpreter- Natale Lay, [employer]   Labs-172 cholesterol, 94 LDL cholesterol, 87 triglycerides, 62 HDL cholesterol, 6.4 hemoglobin A1C, 88 mean plasma glucose. Patient understands and is aware of her lab results.   Goals-  Increase fruits and vegetable intake. (2 cups of fruit and 3 cups of vegetables daily. Increase whole grain intake (whole wheat bread, whole wheat pasta, brown rice and whole grain cereals).    Navigation:  Patient is aware of 1 more health coaching sessions and a follow up.   Time-  10 minutes

## 2021-09-25 NOTE — Patient Instructions (Signed)
Explained breast self awareness with [name]. Patient did not need a Pap smear today due to last Pap smear was 01/16/2021. Let her know that based on her previous Pap smear and result that her next Pap smear is due in three years per BCCCP/ASCCP guidelines. Referred patient to the [facility] for a screening mammogram on mobile unit. Appointment scheduled Thursday, September 25, 2021 at 1520. Patient escorted to the mobile unit following BCCCP appointment for her screening mammogram. Let patient know the Breast Center will follow up with her within the next couple weeks with results of her mammogram by letter or phone. [name] verbalized understanding.  Brae Gartman, Kathaleen Maser, RN 2:37 PM

## 2021-10-10 ENCOUNTER — Encounter: Payer: Self-pay | Admitting: Internal Medicine

## 2021-10-10 ENCOUNTER — Ambulatory Visit: Payer: Self-pay | Admitting: Internal Medicine

## 2021-10-10 ENCOUNTER — Other Ambulatory Visit: Payer: Self-pay

## 2021-10-10 VITALS — BP 156/90 | HR 92 | Resp 12 | Ht 62.0 in | Wt 122.0 lb

## 2021-10-10 DIAGNOSIS — R2231 Localized swelling, mass and lump, right upper limb: Secondary | ICD-10-CM

## 2021-10-10 DIAGNOSIS — Z23 Encounter for immunization: Secondary | ICD-10-CM

## 2021-10-10 DIAGNOSIS — Z975 Presence of (intrauterine) contraceptive device: Secondary | ICD-10-CM

## 2021-10-10 DIAGNOSIS — R300 Dysuria: Secondary | ICD-10-CM

## 2021-10-10 LAB — POCT URINALYSIS DIPSTICK
Bilirubin, UA: NEGATIVE
Blood, UA: NEGATIVE
Glucose, UA: NEGATIVE
Nitrite, UA: NEGATIVE
Protein, UA: NEGATIVE
Spec Grav, UA: 1.02 (ref 1.010–1.025)
Urobilinogen, UA: 0.2 E.U./dL
pH, UA: 6 (ref 5.0–8.0)

## 2021-10-10 MED ORDER — SULFAMETHOXAZOLE-TRIMETHOPRIM 800-160 MG PO TABS: ORAL_TABLET | ORAL | 0 refills | Status: DC

## 2021-10-10 NOTE — Progress Notes (Signed)
    Subjective:    Patient ID: Courtney Daugherty, female   DOB: [birth year], 50 y.o.   MRN: [id number]   HPI  Here to establish  Interpreted by Tildon Husky   Paragard copper IUD:  has been in place for 11 years in March 2023.  Was place at [facility].  She stopped having her regular periods since February 17, 2021  2.   Possible mass in right axillary area, soft.  Has been evaluated by axillary ultrasound in March 2022 without findings of a mass.  Had mammogram last month that was normal as well.  States the palpable abnormality has no associated pain or tenderness.    3.  Discomfort on urination:  noted for past 4 days.  Mild burning sensation in suprapubic area.  Not with every episode of urination.  + urinary frequency.  No incontinence.   No vaginal discharge. No fever Does have low back pain, but no flank pain.   No nausea or vomiting.   Has had urinary infection previously, and felt similarly.    4. HM:  Has not had bivalent COvID vaccination--we are out today. Has not had influenza vaccine this year. No Tdap or Td in past 10 years.    Current Meds  Medication Sig   Multiple Vitamin (MULTIVITAMIN) capsule Take 1 capsule by mouth daily.   paragard intrauterine copper IUD IUD 1 each by Intrauterine route once.   No Known Allergies   Review of Systems    Objective:   BP (!) 156/90 (BP Location: Right Arm, Patient Position: Sitting, Cuff Size: Normal)   Pulse 92   Resp 12   Ht 5\' 2"  (1.575 m)   Wt 122 lb (55.3 kg)   LMP 02/16/2021   BMI 22.31 kg/m   Physical Exam No mass in axilla Exam normal Ua with leuks and ketones--culture Assessment & Plan   Bactrim DS

## 2021-10-10 NOTE — Patient Instructions (Addendum)
Astroglide or Coconut oil  [facility] Department Family Planning Services: Whether you wish to be seen for your appointment in [place] or [place], just call one number: [phone]. This line will accommodate both [place] and Spanish speakers.

## 2021-10-14 LAB — URINE CULTURE

## 2021-10-20 MED ORDER — AMOXICILLIN 500 MG PO CAPS: ORAL_CAPSULE | ORAL | 0 refills | Status: DC

## 2021-11-17 ENCOUNTER — Encounter: Payer: Self-pay | Admitting: Internal Medicine

## 2021-11-17 ENCOUNTER — Ambulatory Visit: Payer: Self-pay | Admitting: Internal Medicine

## 2021-11-17 ENCOUNTER — Telehealth: Payer: Self-pay

## 2021-11-17 ENCOUNTER — Other Ambulatory Visit: Payer: Self-pay

## 2021-11-17 VITALS — BP 150/80 | HR 88 | Resp 12 | Ht 62.0 in | Wt 124.0 lb

## 2021-11-17 DIAGNOSIS — R300 Dysuria: Secondary | ICD-10-CM

## 2021-11-17 DIAGNOSIS — R319 Hematuria, unspecified: Secondary | ICD-10-CM

## 2021-11-17 LAB — POCT URINALYSIS DIPSTICK
Bilirubin, UA: NEGATIVE
Glucose, UA: NEGATIVE
Ketones, UA: NEGATIVE
Nitrite, UA: NEGATIVE
Protein, UA: NEGATIVE
Spec Grav, UA: 1.02 (ref 1.010–1.025)
Urobilinogen, UA: 0.2 E.U./dL
pH, UA: 6 (ref 5.0–8.0)

## 2021-11-17 MED ORDER — NITROFURANTOIN MONOHYD MACRO 100 MG PO CAPS: ORAL_CAPSULE | ORAL | 0 refills | Status: DC

## 2021-11-17 MED ORDER — PHENAZOPYRIDINE HCL 200 MG PO TABS: 200.0000 mg | ORAL_TABLET | Freq: Three times a day (TID) | ORAL | 0 refills | Status: DC | PRN

## 2021-11-17 NOTE — Telephone Encounter (Signed)
Patient would like to be seen after experiencing dysuria. Has the same symptoms as the time before on 10/10/21. Issue started 11/12/21. Took phenazopyridine which helped some but all symptoms have continued.

## 2021-11-17 NOTE — Telephone Encounter (Signed)
Seen by Dr Mulberry 

## 2021-11-17 NOTE — Progress Notes (Signed)
    Subjective:    Patient ID: Courtney Daugherty, female   DOB: 1970-12-26, 51 y.o.   MRN: [id number]   HPI   Dysuria:  for 5 days.  Has also had suprapubic discomfort. No hematuria or fever.  Drinking fluids well.  No nausea or vomiting.  Has been taking Pyridium 100 mg  1 tab daily. Has helped a bit.  Last dose 2 days ago.  Treated for Group B strep UTI mid December with Amoxicillin with resolution of symptoms.  Was initially treated with 3 d course of Bactrim until culture returned.   Has had problems with frequent UTIs in past.  2 to 3 episodes per year.  Has been a problem for the last 6 years.  Periods irregular for 8 months prior to April 2022, then no period.  She is sexually active.  After intercourse, she urinated immediately after.    Current Meds  Medication Sig   Multiple Vitamin (MULTIVITAMIN) capsule Take 1 capsule by mouth daily.   paragard intrauterine copper IUD IUD 1 each by Intrauterine route once.   No Known Allergies   Review of Systems    Objective:   BP (!) 150/80 (BP Location: Right Arm, Patient Position: Sitting, Cuff Size: Normal)   Pulse 88   Resp 12   Ht 5\' 2"  (1.575 m)   Wt 124 lb (56.2 kg)   BMI 22.68 kg/m   Physical Exam NAD Lungs:  CTA CV:  RRR without murmur or rub.  Radial and DP pulses normal and equal Abd:  S, + BS, No HSM or mass, mild suprapubic tenderness.  No flank tenderness.   Ua with mild microscopic hematuria. Assessment & Plan   LIkely acute cystitis:  Macrobid 100 mg twice daily for 3 days.  Pyridium upt to 3 times daily over next 2 days.  Push fluids.  Call if no improvement.   Urine culture sent.

## 2021-11-19 LAB — URINE CULTURE

## 2021-11-28 LAB — FECAL OCCULT BLOOD, IMMUNOCHEMICAL: Fecal Occult Bld: NEGATIVE

## 2021-12-01 ENCOUNTER — Telehealth: Payer: Self-pay

## 2021-12-01 NOTE — Telephone Encounter (Signed)
Called patient via Courtney Daugherty, [employer] to give FIT test results. Informed patient that FIT test was Normal. Patient voiced understanding. 

## 2021-12-03 ENCOUNTER — Telehealth: Payer: Self-pay

## 2021-12-03 NOTE — Telephone Encounter (Signed)
Via Delorise Royals, Spanish Interpreter, [facility]), Patient informed negative Fit Test results. Patient verbalized understanding.

## 2021-12-18 ENCOUNTER — Telehealth: Payer: Self-pay

## 2021-12-18 NOTE — Telephone Encounter (Signed)
As noted previously, the antibiotic should have taken care of the bacteria that grew in the urine culture, so we need to have her return for repeat UA/culture.

## 2021-12-18 NOTE — Telephone Encounter (Signed)
Patient called to get recommendations or appt because she believes previous infection from 11/17/21 has returned. Previously given Macrobid which helped. However since Monday 12/15/21, she has had mild back pain, mild dysuria, and some white discharge (unsure if discharge is related). No other symptoms. Not currently taking any medication to help with this issue. Based on urine culture from 11/17/21 patient had Beta hemolytic Streptococcus, group B.

## 2021-12-25 NOTE — Telephone Encounter (Signed)
Patient expressed that she no longer wants appt since she no longer has any symptoms

## 2022-01-12 ENCOUNTER — Telehealth: Payer: Self-pay

## 2022-01-12 NOTE — Telephone Encounter (Signed)
Left message for patient via Erika McReynolds, [employer] about completing HC 3 for the Wise Woman program. Left name and number for patient to call back.  °

## 2022-01-14 ENCOUNTER — Telehealth: Payer: Self-pay

## 2022-01-14 NOTE — Telephone Encounter (Signed)
Health Coaching 3 ? ?interpreter- Rudene Anda, [place] ?  ?Goals- Patient has been exercising daily. Patient has been walking, running and doing zumba classes. Patient states that she has noticed more energy and feels better overall. Patient has added more vegetables into diet and heart healthy fish. Patient has been eating a serving of vegetables daily at lunch time. Patient has been eating tilapia and tuna weekly.  ?  ?New goal-  ? ?Barrier to reaching goal-  ?  ?Strategies to overcome-  ?  ?Navigation:  Patient is aware of  a follow up session. Patient is scheduled for follow-up appointment on February 16, 2022 @ 1:30 pm. ?  ?Time- 10 minutes ?

## 2022-02-16 ENCOUNTER — Inpatient Hospital Stay: Payer: Self-pay | Attending: Obstetrics and Gynecology | Admitting: *Deleted

## 2022-02-16 VITALS — BP 160/94 | Ht 63.0 in | Wt 126.1 lb

## 2022-02-16 DIAGNOSIS — Z0000 Encounter for general adult medical examination without abnormal findings: Secondary | ICD-10-CM

## 2022-02-16 NOTE — Progress Notes (Signed)
Wisewoman follow up ?  ?Interpreter: Natale Lay, [employer] ? ?Clinical Measurement:  There were no vitals filed for this visit.  ?  ?Medical History:  Patient states that she does not have high cholesterol,  does not know if she has  high blood pressure and she does not have diabetes. ? ?Medications:  Patient states that she does not take medication to lower cholesterol, blood pressure and blood sugar.  Patient does not take an aspirin a day to help prevent a heart attack or stroke.  ?  ?Blood pressure, self measurement: Patient states that she does measure blood pressure from home. She checks her blood pressure monthly. She shares her readings with a health care provider: N/A. ?  ?Nutrition: Patient states that on average she eats 2 cups of fruit and 1 cups of vegetables per day. Patient states that she does eat fish at least 2 times per week. Patient eats less than half servings of whole grains. Patient drinks less than 36 ounces of beverages with added sugar weekly: yes. Patient is currently watching sodium or salt intake: yes. In the past 7 days patient has had 0 drinks containing alcohol. On average patient drinks 0 drinks containing alcohol per day.     ? ?Physical activity:  Patient states that she gets 300 minutes of moderate and 0 minutes of vigorous physical activity each week. ? ?Smoking status:  Patient states that she has has never smoked . ?  ?Quality of life:  Over the past 2 weeks patient states that she had little interest or pleasure in doing things: not at all. She has been feeling down, depressed or hopeless:not at all.  ?  ?Risk reduction and counseling:  ? ?Health Coaching: Patient's BP was elevated during today's visit. Patient is a patient at [facility]. Offered to refer patient to [facility] for FU but explained to patient that [facility] would not be able to pay for follow-up visit. Patient opted to wait until re-screening appointment in May to see if it is still elevated. Patient has  BP monitor at home. Gave patient BP tracking card to use at home. Instructed patient to follow-up sooner with PCP if she notices readings >140/90 over the next month. Patient walks for 1 hour 5 days a week. Encouraged patient to continue with exercise routine. Also spoke with patient about DASH diet in regards to BP. Gave patient brochure that has information about the DASH diet. ?  ?Navigation: This was the  follow up session for this patient, I will check up on her progress in the coming months. ? ?Time: 20 minutes  ?

## 2022-03-26 ENCOUNTER — Other Ambulatory Visit: Payer: Self-pay

## 2022-03-26 ENCOUNTER — Inpatient Hospital Stay: Payer: Self-pay | Attending: Obstetrics and Gynecology | Admitting: *Deleted

## 2022-03-26 VITALS — BP 144/86 | Ht 63.0 in | Wt 126.4 lb

## 2022-03-26 DIAGNOSIS — Z0000 Encounter for general adult medical examination without abnormal findings: Secondary | ICD-10-CM

## 2022-03-26 NOTE — Progress Notes (Signed)
Wisewoman initial screening   Interpreter- Natale Lay, [employer]   Clinical Measurement: There were no vitals filed for this visit. Fasting Labs Drawn Today, will review with patient when they result.   Medical History:  Patient states that she  does know know if she has  high cholesterol or high blood pressure and she does not have diabetes.  Medications:  Patient states that she does not take medication to lower cholesterol, blood pressure or blood sugar.  Patient does not take an aspirin a day to help prevent a heart attack or stroke.    Blood pressure, self measurement: Patient states that she does measure blood pressure from home. She checks her blood pressure a few times per week. She shares her readings with a health care provider: yes.   Nutrition: Patient states that on average she eats 2 cups of fruit and 1 cups of vegetables per day. Patient states that she does eat fish at least 2 times per week. Patient eats about half servings of whole grains. Patient drinks less than 36 ounces of beverages with added sugar weekly: yes. Patient is currently watching sodium or salt intake: yes. In the past 7 days patient has consumed drinks containing alcohol on 0 days. On a day that patient consumes drinks containing alcohol on average 0 drinks are consumed.      Physical activity:  Patient states that she gets 150 minutes of moderate and 150 minutes of vigorous physical activity each week.  Smoking status:  Patient states that she has has never smoked .   Quality of life:  Over the past 2 weeks patient states that she had little interest or pleasure in doing things: not at all. She has been feeling down, depressed or hopeless:not at all.    Risk reduction and counseling:   Health Coaching: Patient overall has a good diet. Did speak with patient about the daily recommendations for fruits and vegetables. Showed patient what a serving size would look like. Spoke with patient about practicing DASH  diet in regards to lowering blood pressure. Spoke with patient about continuing regular exercise as well to help improve blood pressure readings.   Goals: Patient would like to work on lowering blood pressure through diet and exercise changes. Patient will work on making these changes over the next 3 months.     Navigation:  I will notify patient of lab results.  Patient is aware of 2 more health coaching sessions and a follow up.  Time: 20 minutes

## 2022-03-27 LAB — GLUCOSE, RANDOM: Glucose: 97 mg/dL (ref 70–99)

## 2022-03-27 LAB — LIPID PANEL
Chol/HDL Ratio: 2.4 ratio (ref 0.0–4.4)
Cholesterol, Total: 179 mg/dL (ref 100–199)
HDL: 74 mg/dL (ref >39)
LDL Chol Calc (NIH): 94 mg/dL (ref 0–99)
Triglycerides: 54 mg/dL (ref 0–149)
VLDL Cholesterol Cal: 11 mg/dL (ref 5–40)

## 2022-03-27 LAB — HEMOGLOBIN A1C
Est. average glucose Bld gHb Est-mCnc: 105 mg/dL
Hgb A1c MFr Bld: 5.3 % (ref 4.8–5.6)

## 2022-03-30 ENCOUNTER — Telehealth: Payer: Self-pay

## 2022-03-30 NOTE — Telephone Encounter (Signed)
Health coaching 2   interpreter- Natale Lay, [employer]   Labs- 179 cholesterol, 94 LDL cholesterol, 54 triglycerides, 74 HDL cholesterol, 5.3 hemoglobin A1C, 97 mean plasma glucose.  Patient understands and is aware of her lab results.   Goals-  1. Start watching the amount of sweets and sugars consumed.  2. Start watching the amount of sodium consumed since BP was slightly elevated.  3. Continue with daily exercise of at least 20-30 minutes.   Navigation:  Patient is aware of 1 more health coaching sessions and a follow up. Patient will FU with PCP Dr. Delrae Alfred for elevated BP @ appointment in June.   Time-  10 MINUTES

## 2022-04-10 ENCOUNTER — Ambulatory Visit: Payer: Self-pay | Admitting: Internal Medicine

## 2022-04-10 ENCOUNTER — Encounter: Payer: Self-pay | Admitting: Internal Medicine

## 2022-04-10 VITALS — BP 144/88 | HR 76 | Resp 12 | Ht 62.0 in | Wt 126.0 lb

## 2022-04-10 DIAGNOSIS — Z23 Encounter for immunization: Secondary | ICD-10-CM

## 2022-04-10 DIAGNOSIS — R030 Elevated blood-pressure reading, without diagnosis of hypertension: Secondary | ICD-10-CM

## 2022-04-10 DIAGNOSIS — Z0000 Encounter for general adult medical examination without abnormal findings: Secondary | ICD-10-CM

## 2022-04-10 NOTE — Patient Instructions (Addendum)
For vaginal lubrication: Astroglide  Vagisil Silk Coconut oil

## 2022-04-10 NOTE — Progress Notes (Signed)
Subjective:    Patient ID: Courtney Daugherty, female   DOB: 1971/07/01, 51 y.o.   MRN: [id number]   HPI  CPE without pap  1.  Pap:  Last pap 03/18/2021 with ASCUS, HPV negative with [facility], where she has been obtaining gynecology care.  Previous pap smears were normal.  She does not have follow up planned with [facility].  2.  Mammogram:  Last mammogram performed 09/25/21 and normal.  No family history of breast cancer.    3.  Osteoprevention:  Takes calcium and vitamin D.  600 mg and 1000 IU respectively per day.  She is willing to add dairy to diet or take another separate 600 mg supplement of calcium.  Walks or runs daily for about 1 hour.     4.  Guaiac Cards/FIT:  Performed fecal occult blood in January of this year.    5.  Colonoscopy:  Never.  No family history of colon cancer.   6.  Immunizations:  Has not had bivalent COVID booster.  Has not had shingles vaccination.  Immunization History  Administered Date(s) Administered   Influenza Inj Mdck Quad Pf 10/10/2021   PFIZER(Purple Top)SARS-COV-2 Vaccination 01/20/2020, 02/10/2020, 09/17/2020   Tdap 10/10/2021     7.  Glucose/Cholesterol :  Recent A1C was 5.3%.  Cholesterol at excellent levels with high HDL. Lipid Panel     Component Value Date/Time   CHOL 179 03/26/2022 0941   TRIG 54 03/26/2022 0941   HDL 74 03/26/2022 0941   CHOLHDL 2.4 03/26/2022 0941   LDLCALC 94 03/26/2022 0941   LABVLDL 11 03/26/2022 0941     Current Meds  Medication Sig   Multiple Vitamin (MULTIVITAMIN) capsule Take 1 capsule by mouth daily.   No Known Allergies  History reviewed. No pertinent past medical history.  History reviewed. No pertinent surgical history.  Family History  Problem Relation Age of Onset   Diabetes Mother        complication of what sounds like Courtney Daugherty   Hypertension Mother    Heart disease Father    Heart attack Father    Family Status  Relation Name Status   Mother  Deceased at  age 51   Father  Deceased at age 51   Sister  Alive   Sister  Alive   Sister  Deceased at age 51       Died from Eclampsia during childbirth   Sister  Alive   Sister  Alive   Sister  Alive   Brother  Alive   Brother  Alive   Brother  Alive   Brother  Alive   Brother  Alive   Daughter Courtney Daugherty Alive, age 28y   Daughter Courtney Daugherty Alive, age 11y   Son Courtney Daugherty Alive, age 22y   Social History   Socioeconomic History   Marital status: Married    Spouse name: Courtney Daugherty   Number of children: 3   Years of education: Not on file   Highest education level: High school graduate  Occupational History   Occupation: Housewife  Tobacco Use   Smoking status: Never    Passive exposure: Never   Smokeless tobacco: Never  Vaping Use   Vaping Use: Never used  Substance and Sexual Activity   Alcohol use: Yes    Comment: rare   Drug use: No   Sexual activity: Yes    Birth control/protection: Post-menopausal  Other Topics Concern   Not on file  Social History Narrative  Lives at home with her husband, 2 children and her in laws.     Cares for 3 of her grandchildren most days in the afternoon.   Social Determinants of Health   Financial Resource Strain: Low Risk    Difficulty of Paying Living Expenses: Not hard at all  Food Insecurity: No Food Insecurity   Worried About Programme researcher, broadcasting/film/video in the Last Year: Never true   Ran Out of Food in the Last Year: Never true  Transportation Needs: No Transportation Needs   Lack of Transportation (Medical): No   Lack of Transportation (Non-Medical): No  Physical Activity: Not on file  Stress: Not on file  Social Connections: Not on file  Intimate Partner Violence: Not At Risk   Fear of Current or Ex-Partner: No   Emotionally Abused: No   Physically Abused: No   Sexually Abused: No     Review of Systems  HENT:  Negative for dental problem.   Eyes:  Negative for visual disturbance.  Respiratory:  Negative for shortness of breath.    Cardiovascular:  Negative for chest pain, palpitations and leg swelling.  Gastrointestinal:  Negative for abdominal pain, constipation and diarrhea.  Skin:  Negative for rash.  Neurological:  Negative for weakness and numbness.  Psychiatric/Behavioral:  Negative for dysphoric mood. The patient is not nervous/anxious.      Objective:   BP (!) 144/88 (BP Location: Left Arm, Patient Position: Sitting, Cuff Size: Normal)   Pulse 76   Resp 12   Ht 5\' 2"  (1.575 m)   Wt 126 lb (57.2 kg)   LMP 02/16/2021 (Exact Date) Comment: Periods were always regular and every month prior to April, even with IUD in place.  BMI 23.05 kg/m   Physical Exam Constitutional:      Appearance: Normal appearance.  HENT:     Head: Normocephalic and atraumatic.     Right Ear: Tympanic membrane, ear canal and external ear normal.     Left Ear: Tympanic membrane, ear canal and external ear normal.     Nose: Nose normal.     Mouth/Throat:     Mouth: Mucous membranes are moist.     Pharynx: Oropharynx is clear.  Eyes:     Extraocular Movements: Extraocular movements intact.     Conjunctiva/sclera: Conjunctivae normal.     Pupils: Pupils are equal, round, and reactive to light.     Comments: Discs sharp  Neck:     Thyroid: No thyroid mass or thyromegaly.  Cardiovascular:     Rate and Rhythm: Normal rate and regular rhythm.     Heart sounds: S1 normal and S2 normal. No murmur heard.    No friction rub. No S3 or S4 sounds.     Comments: No carotid bruits.  Carotid, radial, femoral, DP and PT pulses normal and equal.   Pulmonary:     Effort: Pulmonary effort is normal.     Breath sounds: Normal breath sounds and air entry.  Chest:  Breasts:    Right: No inverted nipple, mass or nipple discharge.     Left: No inverted nipple, mass or nipple discharge.  Abdominal:     General: Abdomen is flat. Bowel sounds are normal.     Palpations: Abdomen is soft. There is no hepatomegaly, splenomegaly or mass.      Tenderness: There is no abdominal tenderness.     Hernia: No hernia is present.  Genitourinary:    Comments: Normal external female genitalia No uterine or  adnexal mass or tenderness. Musculoskeletal:        General: Normal range of motion.     Cervical back: Normal range of motion and neck supple.     Right lower leg: No edema.     Left lower leg: No edema.  Lymphadenopathy:     Head:     Right side of head: No submental or submandibular adenopathy.     Left side of head: No submental or submandibular adenopathy.     Cervical: No cervical adenopathy.     Upper Body:     Right upper body: No supraclavicular or axillary adenopathy.     Left upper body: No supraclavicular or axillary adenopathy.     Lower Body: No right inguinal adenopathy. No left inguinal adenopathy.  Skin:    General: Skin is warm.     Findings: No rash.     Comments: Dorsal thumb side of hands with increased freckling.  Patient denies solar injury in past--states all of her family has this.  Neurological:     General: No focal deficit present.     Mental Status: She is alert and oriented to person, place, and time.     Cranial Nerves: Cranial nerves 2-12 are intact.     Sensory: Sensation is intact.     Motor: Motor function is intact.     Coordination: Coordination is intact.     Gait: Gait is intact.     Deep Tendon Reflexes: Reflexes are normal and symmetric.  Psychiatric:        Mood and Affect: Mood normal.        Speech: Speech normal.        Behavior: Behavior normal. Behavior is cooperative.      Assessment & Plan    CPE without pap Recommend pap here or with [facility] in 2025. Mammogram in November--to call if she does not get letter to schedule Recent guaiac negative stool  A1C and cholesterol recently excellent Add CBC, CMP today. Shingles vaccine with BP check in 2 weeks Moderna Bivalent today.  Elevated BP:  obviously anxious.  States has monitor at home and generally 120/80 range.   Will have her bring in monitor and compare to ours in 2 weeks.

## 2022-04-11 LAB — COMPREHENSIVE METABOLIC PANEL
ALT: 21 IU/L (ref 0–32)
AST: 27 IU/L (ref 0–40)
Albumin/Globulin Ratio: 1.7 (ref 1.2–2.2)
Albumin: 4.5 g/dL (ref 3.8–4.8)
Alkaline Phosphatase: 87 IU/L (ref 44–121)
BUN/Creatinine Ratio: 21 (ref 9–23)
BUN: 11 mg/dL (ref 6–24)
Bilirubin Total: 0.5 mg/dL (ref 0.0–1.2)
CO2: 22 mmol/L (ref 20–29)
Calcium: 9 mg/dL (ref 8.7–10.2)
Chloride: 105 mmol/L (ref 96–106)
Creatinine, Ser: 0.53 mg/dL — ABNORMAL LOW (ref 0.57–1.00)
Globulin, Total: 2.6 g/dL (ref 1.5–4.5)
Glucose: 84 mg/dL (ref 70–99)
Potassium: 3.8 mmol/L (ref 3.5–5.2)
Sodium: 142 mmol/L (ref 134–144)
Total Protein: 7.1 g/dL (ref 6.0–8.5)
eGFR: 113 mL/min/{1.73_m2} (ref >59)

## 2022-04-11 LAB — CBC WITH DIFFERENTIAL/PLATELET
Basophils Absolute: 0 10*3/uL (ref 0.0–0.2)
Basos: 1 %
EOS (ABSOLUTE): 0.1 10*3/uL (ref 0.0–0.4)
Eos: 1 %
Hematocrit: 41.3 % (ref 34.0–46.6)
Hemoglobin: 13.6 g/dL (ref 11.1–15.9)
Immature Grans (Abs): 0 10*3/uL (ref 0.0–0.1)
Immature Granulocytes: 0 %
Lymphocytes Absolute: 2 10*3/uL (ref 0.7–3.1)
Lymphs: 31 %
MCH: 30.9 pg (ref 26.6–33.0)
MCHC: 32.9 g/dL (ref 31.5–35.7)
MCV: 94 fL (ref 79–97)
Monocytes Absolute: 0.3 10*3/uL (ref 0.1–0.9)
Monocytes: 5 %
Neutrophils Absolute: 3.8 10*3/uL (ref 1.4–7.0)
Neutrophils: 62 %
Platelets: 240 10*3/uL (ref 150–450)
RBC: 4.4 x10E6/uL (ref 3.77–5.28)
RDW: 12.9 % (ref 11.7–15.4)
WBC: 6.3 10*3/uL (ref 3.4–10.8)

## 2022-04-23 ENCOUNTER — Ambulatory Visit: Payer: Self-pay | Admitting: Internal Medicine

## 2022-04-23 VITALS — BP 140/70 | HR 80

## 2022-04-23 DIAGNOSIS — Z0130 Encounter for examination of blood pressure without abnormal findings: Secondary | ICD-10-CM

## 2022-04-23 DIAGNOSIS — Z23 Encounter for immunization: Secondary | ICD-10-CM

## 2022-04-23 NOTE — Progress Notes (Signed)
After discussing bp with Dr Delrae Alfred, patient will start on amlodipine 5mg  daily. Will recheck bp in 2 weeks.

## 2022-04-24 MED ORDER — AMLODIPINE BESYLATE 5 MG PO TABS: 5.0000 mg | ORAL_TABLET | Freq: Every day | ORAL | 3 refills | Status: DC

## 2022-07-29 ENCOUNTER — Telehealth: Payer: Self-pay

## 2022-07-29 NOTE — Telephone Encounter (Signed)
Health Coaching 3  interpreter-  Rudene Anda, [employer]   Goals- She has been walking for exercise. Patient states that she averages 5 days a week for 1.5 hours of walking. Patient states that she has also been drinking more water and less sodas. Patient has also reduced the amount of bread that she consumes. Patient has also added in whole grains into diet.    New goal- Add in more fruits into diet.   Barrier to reaching goal- Does not like to consume fruit by itself.    Strategies to overcome- Try juicing or smoothies.   Navigation:  Patient is aware of  a follow up session. Patient is scheduled for FU on Wednesday, November 1 @ 1:00 pm.   Time-  10 minutes

## 2022-09-09 ENCOUNTER — Ambulatory Visit: Payer: No Typology Code available for payment source

## 2022-09-14 ENCOUNTER — Ambulatory Visit: Payer: No Typology Code available for payment source

## 2022-10-12 ENCOUNTER — Inpatient Hospital Stay: Payer: No Typology Code available for payment source | Attending: Obstetrics and Gynecology | Admitting: *Deleted

## 2022-10-12 VITALS — BP 151/86 | Ht 63.0 in | Wt 127.0 lb

## 2022-10-12 DIAGNOSIS — Z0000 Encounter for general adult medical examination without abnormal findings: Secondary | ICD-10-CM

## 2022-10-12 NOTE — Progress Notes (Signed)
[facility] follow up   Interpreter: Natale Lay, [employer]  Clinical Measurement:  There were no vitals filed for this visit.    Medical History:  Patient states that she does not have high cholesterol, has high blood pressure and she does not have diabetes.  Medications:  Patient states that she does take medication to lower cholesterol, blood pressure and blood sugar.  Patient does not take an aspirin a day to help prevent a heart attack or stroke. During the past 7 days patient has taken prescribed medication to lower blood pressure on 7 days.   Blood pressure, self measurement:  :  Patient states that she does measure blood pressure from home. She checks her blood pressure weekly. She shares her readings with a health care provider: yes.   Nutrition: Patient states that on average she eats 2 cups of fruit and 1 cups of vegetables per day. Patient states that she does eat fish at least 2 times per week. Patient eats about half servings of whole grains. Patient drinks less than 36 ounces of beverages with added sugar weekly: yes. Patient is currently watching sodium or salt intake: yes. In the past 7 days patient has had 0 drinks containing alcohol. On average patient drinks 0 drinks containing alcohol per day.      Physical activity:  Patient states that she gets 120 minutes of moderate and 120 minutes of vigorous physical activity each week.  Smoking status:  Patient states that she has has never smoked .   Quality of life:  Over the past 2 weeks patient states that she had little interest or pleasure in doing things: not at all. She has been feeling down, depressed or hopeless:not at all.    Risk reduction and counseling:   Health Coaching: Spoke in detail with patient about her elevated BP. Patient is currently taking amlodipine 5 mg daily. Patient endorses that she takes her medication at the same time every day. Patient has been checking her BP at home at least once per week. Per patient  readings have been in the 130's/80's. Over the past week patient states that she has woken up during the night with intense headaches. Patient does not endorse a headache today in office. Encouraged patient to FU with PCP Dr. Delrae Alfred. Will send Dr. Delrae Alfred a message alerting her of elevated readings today in office. Encouraged patient to continue watching the amount of sodium/salt that she consumes. Discussed DASH diet with patient. Patient has been walking/running 4 days a week for an hour.   Navigation: This was the  follow up session for this patient, I will check up on her progress in the coming months.  Time: 25 minutes

## 2022-10-15 ENCOUNTER — Ambulatory Visit: Payer: Self-pay | Admitting: Internal Medicine

## 2022-11-17 ENCOUNTER — Ambulatory Visit (INDEPENDENT_AMBULATORY_CARE_PROVIDER_SITE_OTHER): Payer: Self-pay | Admitting: Internal Medicine

## 2022-11-17 DIAGNOSIS — Z23 Encounter for immunization: Secondary | ICD-10-CM

## 2022-12-08 ENCOUNTER — Ambulatory Visit: Payer: Self-pay | Admitting: *Deleted

## 2022-12-08 ENCOUNTER — Ambulatory Visit
Admission: RE | Admit: 2022-12-08 | Discharge: 2022-12-08 | Disposition: A | Discharge status: Home / Self Care | Payer: No Typology Code available for payment source | Source: Ambulatory Visit | Attending: Internal Medicine | Admitting: Internal Medicine

## 2022-12-08 ENCOUNTER — Other Ambulatory Visit: Payer: Self-pay

## 2022-12-08 VITALS — BP 138/83 | Wt 119.0 lb

## 2022-12-08 DIAGNOSIS — Z1211 Encounter for screening for malignant neoplasm of colon: Secondary | ICD-10-CM

## 2022-12-08 DIAGNOSIS — Z1231 Encounter for screening mammogram for malignant neoplasm of breast: Secondary | ICD-10-CM

## 2022-12-08 DIAGNOSIS — Z01419 Encounter for gynecological examination (general) (routine) without abnormal findings: Secondary | ICD-10-CM

## 2022-12-08 NOTE — Progress Notes (Signed)
Courtney Daugherty is a 52 y.o. female who presents to [facility] clinic today with no complaints.    Pap Smear: Pap smear not completed today. Last Pap smear was 03/18/2021 at the [facility] and was abnormal - ASCUS with negative HPV. Per patient has no history of an abnormal Pap smear prior to her most recent Pap smear. Last Pap smear result is available in Epic.    Physical exam: Breasts Breasts symmetrical. No skin abnormalities bilateral breasts. No nipple retraction bilateral breasts. No nipple discharge bilateral breasts. No lymphadenopathy left axilla. Right axillary lymphadenopathy. No lumps palpated bilateral breasts. Unable to palpate a lump in right axilla. Patients area of concern of lump is right upper arm and was noted on previous exam 01/16/2021. Right breast ultrasound was completed for follow up 01/16/2021 that was negative. No complaints of pain or tenderness on exam.     MM 3D SCREEN BREAST BILATERAL  Result Date: 09/29/2021 CLINICAL DATA:  Screening. EXAM: DIGITAL SCREENING BILATERAL MAMMOGRAM WITH TOMOSYNTHESIS AND CAD TECHNIQUE: Bilateral screening digital craniocaudal and mediolateral oblique mammograms were obtained. Bilateral screening digital breast tomosynthesis was performed. The images were evaluated with computer-aided detection. COMPARISON:  None. ACR Breast Density Category c: The breast tissue is heterogeneously dense, which may obscure small masses FINDINGS: There are no findings suspicious for malignancy. IMPRESSION: No mammographic evidence of malignancy. A result letter of this screening mammogram will be mailed directly to the patient. RECOMMENDATION: Screening mammogram in one year. (Code:SM-B-01Y) BI-RADS CATEGORY  1: Negative. Electronically Signed   By: Ammie Ferrier M.D.   On: 09/29/2021 07:48   MS DIGITAL DIAG TOMO UNI RIGHT  Result Date: 09/17/2020 CLINICAL DATA:  Patient returns after screening study for evaluation of  possible RIGHT breast asymmetry. EXAM: DIGITAL DIAGNOSTIC UNILATERAL RIGHT MAMMOGRAM WITH TOMO AND CAD COMPARISON:  Previous exam(s). ACR Breast Density Category c: The breast tissue is heterogeneously dense, which may obscure small masses. FINDINGS: Additional 2-D and 3-D images are performed. These views show no persistent mass or asymmetry in the superior or mid portion of the RIGHT breast. Mammographic images were processed with CAD. IMPRESSION: No mammographic evidence for malignancy. RECOMMENDATION: Screening mammogram in one year.(Code:SM-B-01Y) I have discussed the findings and recommendations with the patient and mother. If applicable, a reminder letter will be sent to the patient regarding the next appointment. BI-RADS CATEGORY  1: Negative. Electronically Signed   By: Nolon Nations M.D.   On: 09/17/2020 09:18   MS DIGITAL SCREENING TOMO BILATERAL  Result Date: 08/30/2020 CLINICAL DATA:  Screening. EXAM: DIGITAL SCREENING BILATERAL MAMMOGRAM WITH TOMO AND CAD COMPARISON:  Previous exam(s). ACR Breast Density Category c: The breast tissue is heterogeneously dense, which may obscure small masses. FINDINGS: In the right breast, a possible asymmetry warrants further evaluation. In the left breast, no findings suspicious for malignancy. Images were processed with CAD. IMPRESSION: Further evaluation is suggested for possible asymmetry in the right breast. RECOMMENDATION: Diagnostic mammogram and possibly ultrasound of the right breast. (Code:FI-R-1M) The patient will be contacted regarding the findings, and additional imaging will be scheduled. BI-RADS CATEGORY  0: Incomplete. Need additional imaging evaluation and/or prior mammograms for comparison. Electronically Signed   By: Nolon Nations M.D.   On: 08/30/2020 13:29   MS DIGITAL SCREENING TOMO BILATERAL  Result Date: 07/12/2019 CLINICAL DATA:  Screening. EXAM: DIGITAL SCREENING BILATERAL MAMMOGRAM WITH TOMO AND CAD COMPARISON:  Previous  exam(s). ACR Breast Density Category c: The breast tissue is heterogeneously dense,  which may obscure small masses. FINDINGS: There are no findings suspicious for malignancy. Images were processed with CAD. IMPRESSION: No mammographic evidence of malignancy. A result letter of this screening mammogram will be mailed directly to the patient. RECOMMENDATION: Screening mammogram in one year. (Code:SM-B-01Y) BI-RADS CATEGORY  1: Negative. Electronically Signed   By: Curlene Dolphin M.D.   On: 07/12/2019 11:34   MS DIGITAL SCREENING TOMO BILATERAL  Result Date: 03/16/2018 CLINICAL DATA:  Screening. EXAM: DIGITAL SCREENING BILATERAL MAMMOGRAM WITH TOMO AND CAD COMPARISON:  Previous exam(s). ACR Breast Density Category c: The breast tissue is heterogeneously dense, which may obscure small masses. FINDINGS: There are no findings suspicious for malignancy. Images were processed with CAD. IMPRESSION: No mammographic evidence of malignancy. A result letter of this screening mammogram will be mailed directly to the patient. RECOMMENDATION: Screening mammogram in one year. (Code:SM-B-01Y) BI-RADS CATEGORY  1: Negative. Electronically Signed   By: Lajean Manes M.D.   On: 03/16/2018 14:36     Pelvic/Bimanual Pap is not indicated today per BCCCP guidelines.   Smoking History: Patient has never smoked.  Patient Navigation: Patient education provided. Access to services provided for patient through [facility] program. Spanish interpreter Rudene Anda from [facility] provided.   Colorectal Cancer Screening: Per patient has never had colonoscopy completed. FIT Test given to patient to complete. No complaints today.    Breast and Cervical Cancer Risk Assessment: Patient does not have family history of breast cancer, known genetic mutations, or radiation treatment to the chest before age 23. Patient does not have history of cervical dysplasia, immunocompromised, or DES exposure in-utero.  Risk Scores as of 12/08/2022      Courtney Daugherty           5-year 0.73 %   Lifetime 6.42 %   This patient is [place] but has no documented birth country, so the Courtney Daugherty used data from [place] patients to calculate their risk score. Document a birth country in the Demographics activity for a more accurate score.         Last calculated by Claretha Cooper, CMA on 12/08/2022 at  2:03 PM        A: BCCCP exam without pap smear No complaints.  P: [facility] on [facility]. Appointment scheduled Tuesday, December 08, 2022 at 1430.  Loletta Parish, RN 12/08/2022 2:08 PM

## 2022-12-08 NOTE — Patient Instructions (Addendum)
Explained breast self awareness with Courtney Daugherty. Patient did not need a Pap smear today due to last Pap smear was 01/16/2021. Let her know that based on her previous Pap smear and result that her next Pap smear is due in three years per BCCCP/ASCCP guidelines. Referred patient to the [facility] for a screening mammogram on mobile unit. Appointment scheduled Tuesday, December 08, 2022 at 1430. Patient aware of appointment and will be there. Let [facility] know [facility] will follow up with her within the next couple weeks with results of her mammogram by letter or phone. Courtney Daugherty verbalized understanding.  Kellen Dutch, Arvil Chaco, RN 2:09 PM

## 2022-12-11 ENCOUNTER — Other Ambulatory Visit: Payer: Self-pay

## 2022-12-14 ENCOUNTER — Other Ambulatory Visit (INDEPENDENT_AMBULATORY_CARE_PROVIDER_SITE_OTHER): Payer: Self-pay

## 2022-12-14 DIAGNOSIS — N949 Unspecified condition associated with female genital organs and menstrual cycle: Secondary | ICD-10-CM

## 2022-12-14 LAB — POCT URINALYSIS DIPSTICK
Bilirubin, UA: NEGATIVE
Blood, UA: NEGATIVE
Glucose, UA: NEGATIVE
Ketones, UA: NEGATIVE
Leukocytes, UA: NEGATIVE
Nitrite, UA: NEGATIVE
Protein, UA: NEGATIVE
Spec Grav, UA: 1.02 (ref 1.010–1.025)
Urobilinogen, UA: 0.2 E.U./dL
pH, UA: 8 (ref 5.0–8.0)

## 2022-12-14 NOTE — Progress Notes (Signed)
Burning while urinating, back pain, general vaginal discomfort for about a week. Took a over the counter medication for UTI but does not remember the name. Medication help with the discomfort but did not clear it completely. Patient denies discharge.  Patient has been scheduled for appointment with PCP.

## 2022-12-16 ENCOUNTER — Ambulatory Visit: Payer: Self-pay | Admitting: Internal Medicine

## 2022-12-16 ENCOUNTER — Encounter: Payer: Self-pay | Admitting: Internal Medicine

## 2022-12-16 VITALS — BP 142/82 | HR 72 | Resp 12 | Ht 62.0 in | Wt 126.0 lb

## 2022-12-16 DIAGNOSIS — N952 Postmenopausal atrophic vaginitis: Secondary | ICD-10-CM

## 2022-12-16 DIAGNOSIS — R300 Dysuria: Secondary | ICD-10-CM

## 2022-12-16 LAB — POCT WET PREP WITH KOH
KOH Prep POC: NEGATIVE
RBC Wet Prep HPF POC: NEGATIVE
Trichomonas, UA: NEGATIVE
WBC Wet Prep HPF POC: NEGATIVE
Yeast Wet Prep HPF POC: NEGATIVE

## 2022-12-16 NOTE — Patient Instructions (Signed)
Astro glide Vagisil silk Coconut oil

## 2022-12-16 NOTE — Progress Notes (Signed)
    Subjective:    Patient ID: Courtney Daugherty, female   DOB: 1971-01-17, 52 y.o.   MRN: [id number]   HPI  Discomfort in suprapubic/vaginal area.  Burning sensation from vaginal area.  No vaginal discharge or odor.  She is menopausal.  Has not had a period in 1 year or a bit more.   Problem for about 2 weeks.  There all the time that improves at times and then flares.  After urinating, the area is warm and sensation stays with her.   No blood on tissue when wipes   Has not had intercourse during the 2 weeks of symptoms, but may have done so a month prior to symptoms.  She is with her husband. No fever Perhaps a bit more frequency with urination. No urinary incontinence.   Has never had this before.   Has had dysuria before, but that discomfort was different that this discomfort.      Current Meds  Medication Sig   amLODipine (NORVASC) 5 MG tablet Take 1 tablet (5 mg total) by mouth daily.   Calcium Carb-Cholecalciferol (CALCIUM 600 + D PO) Take by mouth daily.   ibuprofen (ADVIL) 600 MG tablet Take 600 mg by mouth every 6 (six) hours as needed for headache.   Multiple Vitamin (MULTIVITAMIN) capsule Take 1 capsule by mouth daily.   No Known Allergies   Review of Systems    Objective:   BP (!) 142/82 (BP Location: Left Arm, Patient Position: Sitting, Cuff Size: Normal)   Pulse 72   Resp 12   Ht 5\' 2"  (1.575 m)   Wt 126 lb (57.2 kg)   LMP 02/16/2021 (Exact Date) Comment: Periods were always regular and every month prior to April, even with IUD in place.  BMI 23.05 kg/m   Physical Exam Abd:  S, Tender over suprapubic area mildly.  No HSM or mass, + BS Atrophic vaginal and cervical mucosa.  No significant d/c, no odor.  Tender over suprapubic area and on bimanual of uterus, which is without mass or enlargement.  No adnexal mass or tenderness.  No flank tenderness  + whiff, minimal clue cells--almost acellular Assessment & Plan    GU discomfort:  only findings  are atrophic vaginal and cervical mucosa with mild inflammation and mild BV.  Send urine for culture, but suspect will be negative as UA recently normal.  If culture is negative, will treat for BV and see if issue resolves

## 2022-12-18 LAB — URINE CULTURE

## 2022-12-18 MED ORDER — METRONIDAZOLE 500 MG PO TABS: 500.0000 mg | ORAL_TABLET | Freq: Two times a day (BID) | ORAL | 0 refills | Status: AC

## 2023-01-02 LAB — FECAL OCCULT BLOOD, IMMUNOCHEMICAL: Fecal Occult Bld: NEGATIVE

## 2023-01-14 ENCOUNTER — Telehealth: Payer: Self-pay

## 2023-01-14 NOTE — Telephone Encounter (Signed)
Offered patient an acute, patient stated that she can only do afternoon appointments.

## 2023-01-14 NOTE — Telephone Encounter (Signed)
Needs appointment because dysuria has not completely cleared. Patient finished the antibiotic and sx got better, but never completely clear. She has not taken any other medications

## 2023-01-18 ENCOUNTER — Ambulatory Visit: Payer: Self-pay | Admitting: Internal Medicine

## 2023-01-18 ENCOUNTER — Encounter: Payer: Self-pay | Admitting: Internal Medicine

## 2023-01-18 VITALS — BP 150/80 | HR 84 | Resp 12 | Ht 62.0 in | Wt 126.2 lb

## 2023-01-18 DIAGNOSIS — I10 Essential (primary) hypertension: Secondary | ICD-10-CM | POA: Insufficient documentation

## 2023-01-18 DIAGNOSIS — N952 Postmenopausal atrophic vaginitis: Secondary | ICD-10-CM | POA: Insufficient documentation

## 2023-01-18 DIAGNOSIS — R300 Dysuria: Secondary | ICD-10-CM

## 2023-01-18 DIAGNOSIS — N761 Subacute and chronic vaginitis: Secondary | ICD-10-CM

## 2023-01-18 LAB — POCT WET PREP WITH KOH
Clue Cells Wet Prep HPF POC: NEGATIVE
KOH Prep POC: NEGATIVE
RBC Wet Prep HPF POC: NEGATIVE
Trichomonas, UA: NEGATIVE

## 2023-01-18 LAB — POCT URINALYSIS DIPSTICK
Bilirubin, UA: NEGATIVE
Blood, UA: NEGATIVE
Glucose, UA: NEGATIVE
Ketones, UA: NEGATIVE
Leukocytes, UA: NEGATIVE
Nitrite, UA: NEGATIVE
Protein, UA: NEGATIVE
Spec Grav, UA: 1.015 (ref 1.010–1.025)
Urobilinogen, UA: 0.2 E.U./dL
pH, UA: 6 (ref 5.0–8.0)

## 2023-01-18 MED ORDER — FLUCONAZOLE 150 MG PO TABS: ORAL_TABLET | ORAL | 0 refills | Status: DC

## 2023-01-18 MED ORDER — AMLODIPINE BESYLATE 10 MG PO TABS: ORAL_TABLET | ORAL | 11 refills | Status: DC

## 2023-01-18 NOTE — Progress Notes (Signed)
.     Subjective:    Patient ID: Courtney Daugherty, female   DOB: 29-Oct-1971, 52 y.o.   MRN: [id number]   HPI  Was seen for GU complaints end of February.  Urine culture was negative.  Wet prep showed Clue cells and + whiff, though wet prep devoid of much in way of cells.  She did improve about 75% on Metronidazole, but did not completely resolve with her discomfort and returns today because of that.   She was noted on exam to have vaginal and cervical mucosal atrophy. She states she has discomfort from her umbilicus to vaginal opening after urinating.   She always has discomfort in same area, just worse after urinating.  Also radiates around to her low back.   + frequency.   Not having intercourse very frequently.  It is not comfortable, but not as painful as after when urinates.    No odor with urination or even when does not urinate.    Current Meds  Medication Sig   amLODipine (NORVASC) 5 MG tablet Take 1 tablet (5 mg total) by mouth daily.   ascorbic acid (VITAMIN C) 500 MG tablet Take 500 mg by mouth daily.   Calcium Carb-Cholecalciferol (CALCIUM 600 + D PO) Take by mouth daily.   ibuprofen (ADVIL) 600 MG tablet Take 600 mg by mouth every 6 (six) hours as needed for headache.   Multiple Vitamin (MULTIVITAMIN) capsule Take 1 capsule by mouth daily.   No Known Allergies   Review of Systems    Objective:   BP (!) 150/80 (BP Location: Left Arm, Patient Position: Sitting, Cuff Size: Normal)   Pulse 84   Resp 12   Ht 5\' 2"  (1.575 m)   Wt 126 lb 4 oz (57.3 kg)   LMP 02/16/2021 (Exact Date) Comment: Periods were always regular and every month prior to April, even with IUD in place.  BMI 23.09 kg/m   Physical Exam NAD Lungs:  CTA CV: RRR Abd:  S, mild tenderness in suprapubic area and less so in bilateral lower quadrants.  No HSM or mass, + BS GU:  + cystocele, mild to moderate with tiny mucosal tag just behind urethral opening.  No inflammation noted in urethral  area.  No vaginal discharge.  Vaginal mucosa without inflammation.  Cervix with slight atrophy and inflammation.  No uterine mass, but tender with palpation of uterus at suprapubic level.  No adnexal mass or tenderness.       Assessment & Plan   Vaginitis:  Wet prep supports budding yeast:  will treat with Fluconazole 150 mg daily for 3 days.  Also send GC/chlamydia and check HIV, HepC.  UA again normal.  2.  Hypertension:  increase amlodipine to 10 mg daily.  BP check in 1 month.

## 2023-01-18 NOTE — Telephone Encounter (Signed)
Patient has been scheduled

## 2023-01-19 LAB — HIV ANTIBODY (ROUTINE TESTING W REFLEX): HIV Screen 4th Generation wRfx: NONREACTIVE

## 2023-01-19 LAB — HEPATITIS C ANTIBODY: Hep C Virus Ab: NONREACTIVE

## 2023-01-21 LAB — GC/CHLAMYDIA PROBE AMP
Chlamydia trachomatis, NAA: NEGATIVE
Neisseria Gonorrhoeae by PCR: NEGATIVE

## 2023-02-15 ENCOUNTER — Other Ambulatory Visit: Payer: Self-pay | Admitting: Internal Medicine

## 2023-04-14 ENCOUNTER — Inpatient Hospital Stay: Payer: No Typology Code available for payment source | Attending: Obstetrics and Gynecology | Admitting: *Deleted

## 2023-04-14 ENCOUNTER — Other Ambulatory Visit: Payer: Self-pay

## 2023-04-14 VITALS — BP 142/80 | Ht 63.0 in | Wt 126.2 lb

## 2023-04-14 DIAGNOSIS — Z0000 Encounter for general adult medical examination without abnormal findings: Secondary | ICD-10-CM

## 2023-04-14 NOTE — Progress Notes (Signed)
[facility]isewoman initial screening   Interpreter- Natale Lay, [employer]   Clinical Measurement: There were no vitals filed for this visit. Fasting Labs Drawn Today, will review with patient when they result.   Medical History: Patient states that she does not have high cholesterol, has high blood pressure and she does not have diabetes.  Medications: Patient states that she does not take medication to lower cholesterol, blood pressure or blood sugar.  Patient does not take an aspirin a day to help prevent a heart attack or stroke. During the past 7 days patient has taken prescribed medication to lower blood pressure on 7 days.   Blood pressure, self measurement: Patient states that she does measure blood pressure from home. She checks her blood pressure monthly. She shares her readings with a health care provider: yes.   Nutrition: Patient states that on average she eats 2 cups of fruit and 1 cups of vegetables per day. Patient states that she does eat fish at least 2 times per week. Patient eats more than half servings of whole grains. Patient drinks less than 36 ounces of beverages with added sugar weekly: yes. Patient is currently watching sodium or salt intake: yes. In the past 7 days patient has consumed drinks containing alcohol on 0 days. On a day that patient consumes drinks containing alcohol on average 0 drinks are consumed.      Physical activity: Patient states that she gets 225 minutes of moderate and 225 minutes of vigorous physical activity each week.  Smoking status: Patient states that she has has never smoked .   Quality of life: Over the past 2 weeks patient states that she had little interest or pleasure in doing things: not at all. She has been feeling down, depressed or hopeless:not at all.   Social Determinants of Health Assessment:   Computer Use: During the last 12 months patient states that she has used any of the following: desktop/laptop, smart phone or tablet/other  portable wireless computer: yes.   Internet Use: During the last 12 months, did you or any member of your household have access to the internet: Yes, by paying a cell phone company or internet service provider.   Food Insecurities: During the last 12 months, where there any times when you were worried that you would run out of food because of a lack of money or other resources: No.   Transportation Barriers: During the last 12 months, have you missed a doctor's appointment because of transportation problems: No.   Childcare Barriers: If you are currently using childcare services, please identify  the type of services you use. (If not using childcare services, please select "Not applicable"): not applicable. During the last 12 months, have you had any barriers to childcare services such as: not applicable.   Housing: What is your housing situation today: I have housing.   Intimate Partner Violence: During the last 12 months, how often did your partner physically hurt you: never. During the last 12 months, how often did your partner insult you or talk down to you: never.  Medication Adherence: During the last 12 months, did you ever forget to take your medicine: No. During the last 12 months, were you careless ar times about taking your medicine: No. During the last 12 months, when you felt better did you sometimes stop taking your medication: No. During the last 12 months, sometimes if you felt worse when you took your medicine did you stop taking it: No.   Risk reduction and  counseling:   Health Coaching: Patient overall practices a healthy diet. Spoke with patient about adding more vegetables into daily diet. Encouraged patient to continue watching sodium intake. Spoke in detail with patient about the DASH diet. Patient exercises 5 days a week for 1.5 hours. Patient does a combo of walking and running. Encouraged patient to keep up the great work with her exercising.   Goal: Patient wants to  work on improving her diet by practicing a heart healthy and DASH diet. Patient will work on eating the daily recommendations of the different food groups over the next month. Gave patient a heart healthy cook book, a print off picture of the DASH diet wheel and a [employer] brochure to help with preparing healthy meals.    Navigation:  I will notify patient of lab results.  Patient is aware of 2 more health coaching sessions and a follow up.  Time: 25 minutes

## 2023-04-15 LAB — LIPID PANEL
Chol/HDL Ratio: 2.4 ratio (ref 0.0–4.4)
Cholesterol, Total: 168 mg/dL (ref 100–199)
HDL: 70 mg/dL (ref >39)
LDL Chol Calc (NIH): 81 mg/dL (ref 0–99)
Triglycerides: 93 mg/dL (ref 0–149)
VLDL Cholesterol Cal: 17 mg/dL (ref 5–40)

## 2023-04-15 LAB — HEMOGLOBIN A1C
Est. average glucose Bld gHb Est-mCnc: 105 mg/dL
Hgb A1c MFr Bld: 5.3 % (ref 4.8–5.6)

## 2023-04-15 LAB — GLUCOSE, RANDOM: Glucose: 84 mg/dL (ref 70–99)

## 2023-04-16 ENCOUNTER — Encounter: Payer: Self-pay | Admitting: Internal Medicine

## 2023-04-19 ENCOUNTER — Telehealth: Payer: Self-pay

## 2023-04-19 NOTE — Telephone Encounter (Signed)
Health coaching 2   interpreter- Natale Lay, [employer]   Labs- 168 cholesterol, 81 LDL cholesterol, 93 triglycerides, 70 HDL cholesterol, 5.3 hemoglobin A1C, 84 mean plasma glucose. Patient understands and is aware of her lab results.   Goals-  1. Continue with daily exercise for 20-30 minutes. 2. Continue practicing low-sodium diet/ DASH diet.   Navigation:  Patient is aware of 1 more health coaching sessions and a follow up. Patient is already scheduled for FU on 04/30/23 with Dr. Delrae Alfred and will FU with her for elevated BP.   Time- 10 minutes

## 2023-04-21 IMAGING — MG MM DIGITAL SCREENING BILAT W/ TOMO AND CAD
8 series · 9 of 24 positions shown · non-contrast
Comparison: None.

CLINICAL DATA: Screening.

EXAM:
DIGITAL SCREENING BILATERAL MAMMOGRAM WITH TOMOSYNTHESIS AND CAD
TECHNIQUE: Bilateral screening digital craniocaudal and mediolateral oblique
mammograms were obtained. Bilateral screening digital breast
tomosynthesis was performed. The images were evaluated with
computer-aided detection.

[R MLO synth-2D]
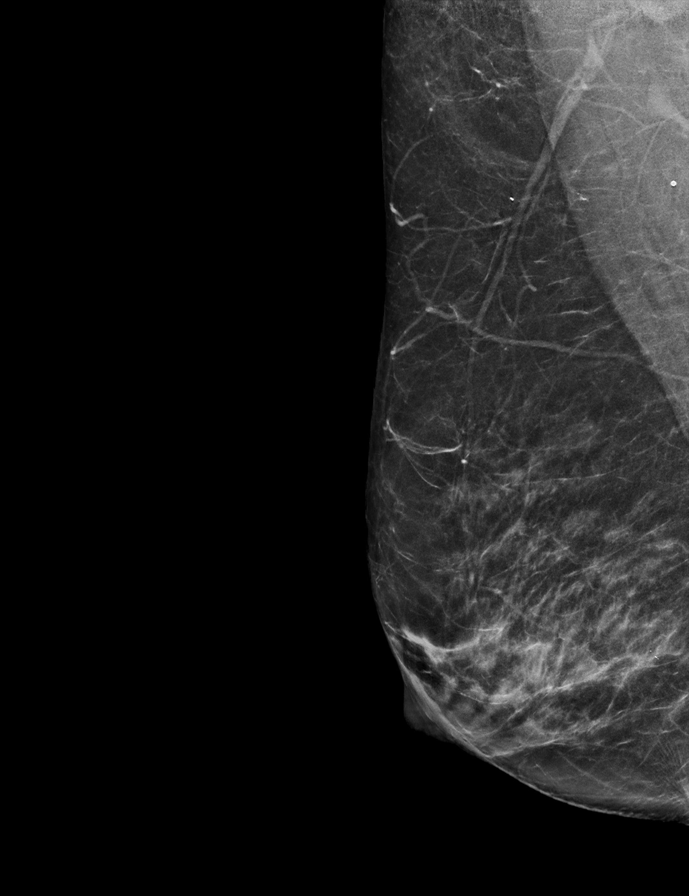

[L CC synth-2D]
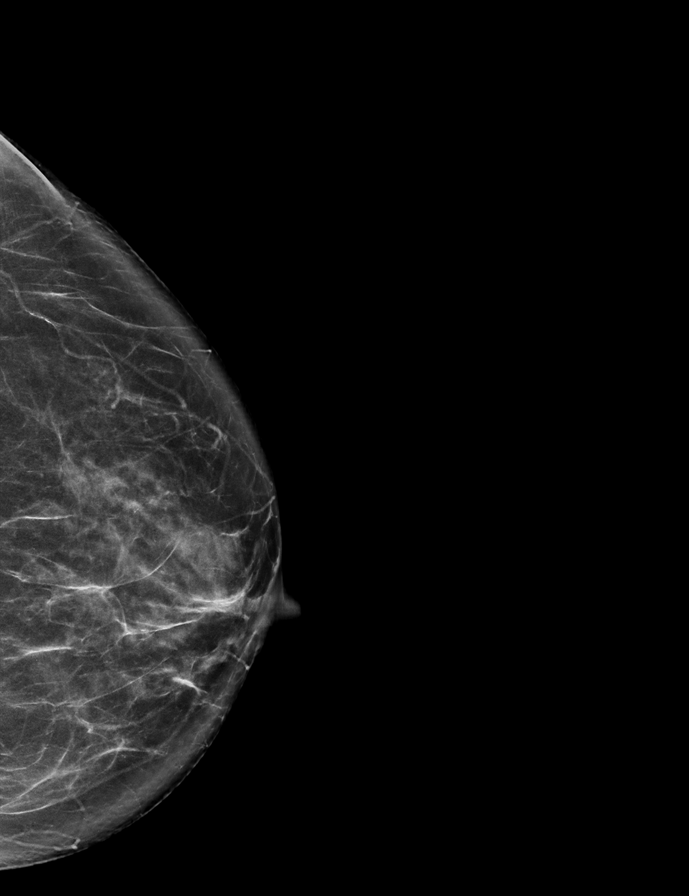

[R CC synth-2D]
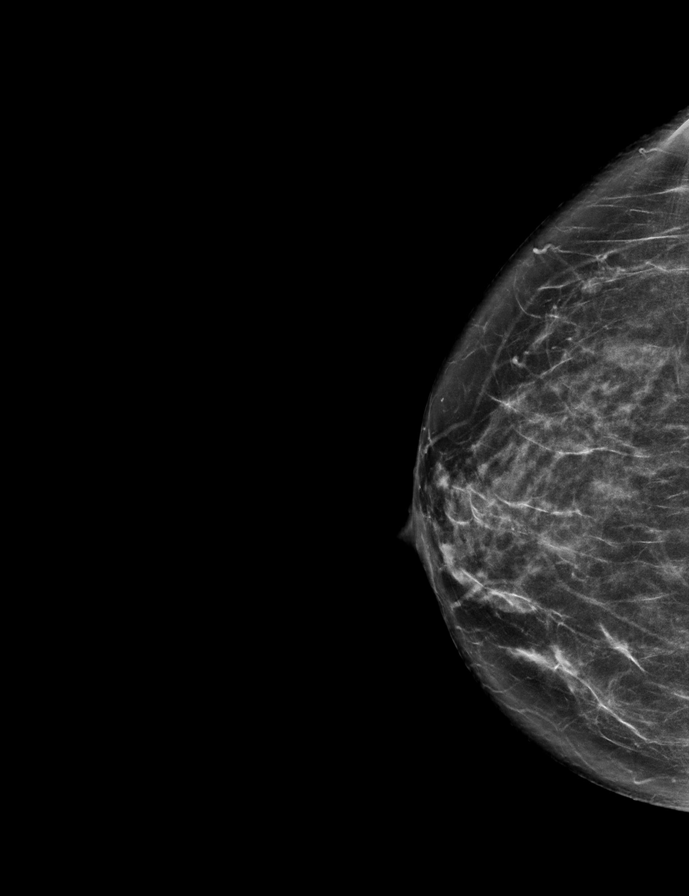

[L MLO synth-2D]
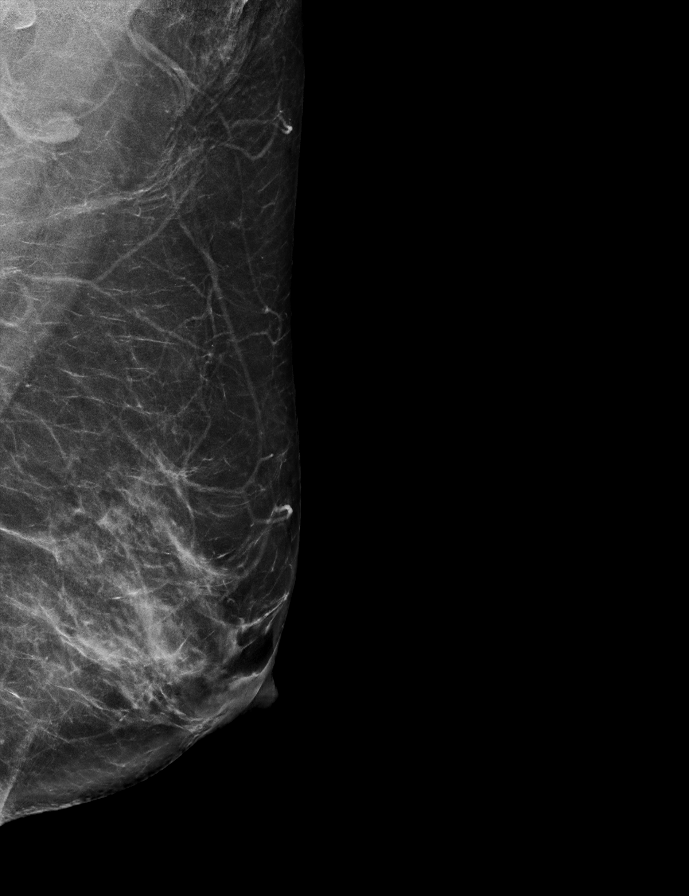

[L MLO tomo · 2 of 70 frames shown]
[frame 23/70]
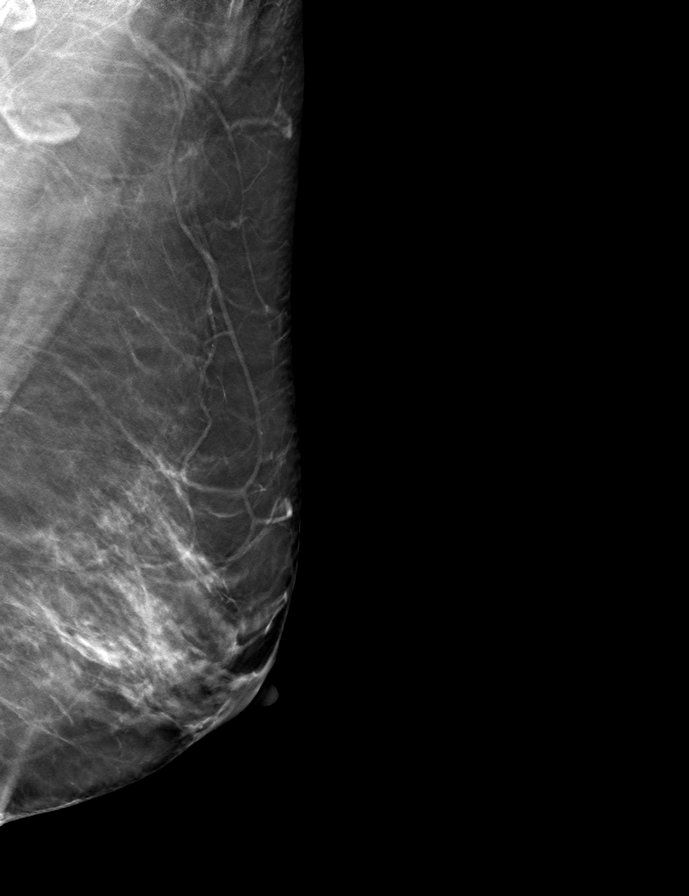
[frame 35/70]
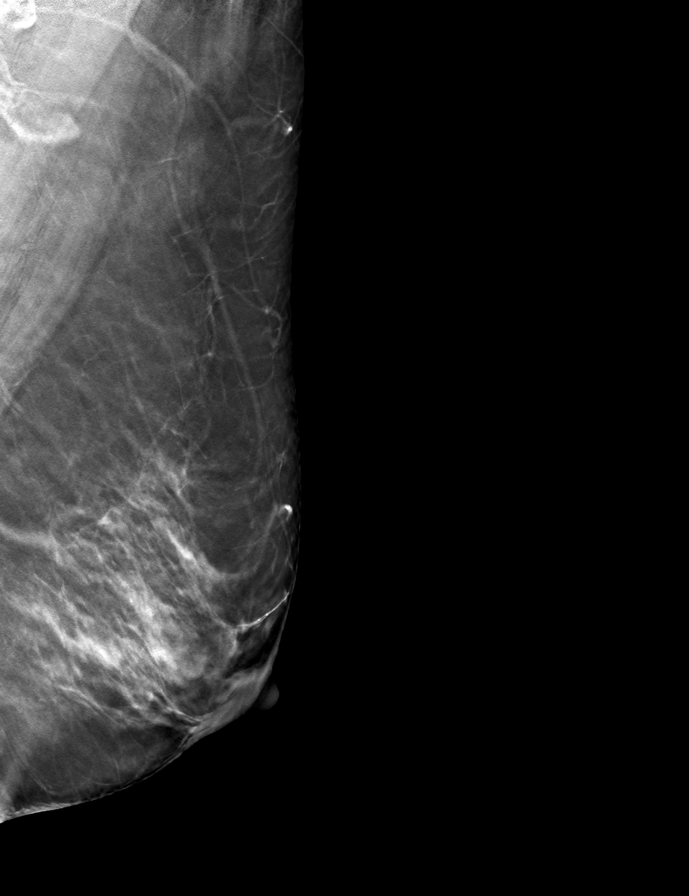

[R CC tomo · tomo slice 32/63.0]
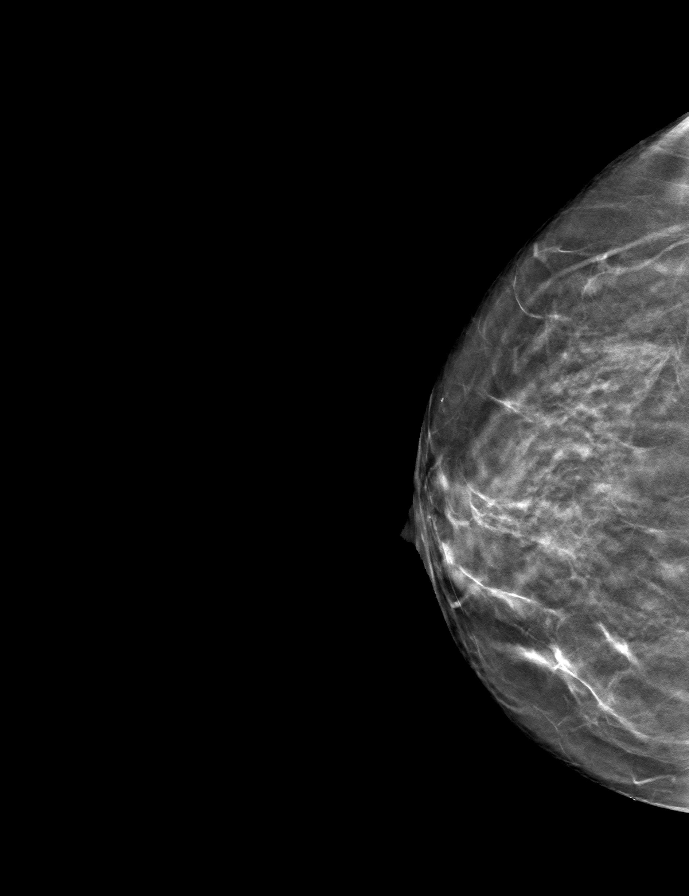

[R MLO tomo · tomo slice 32/63.0]
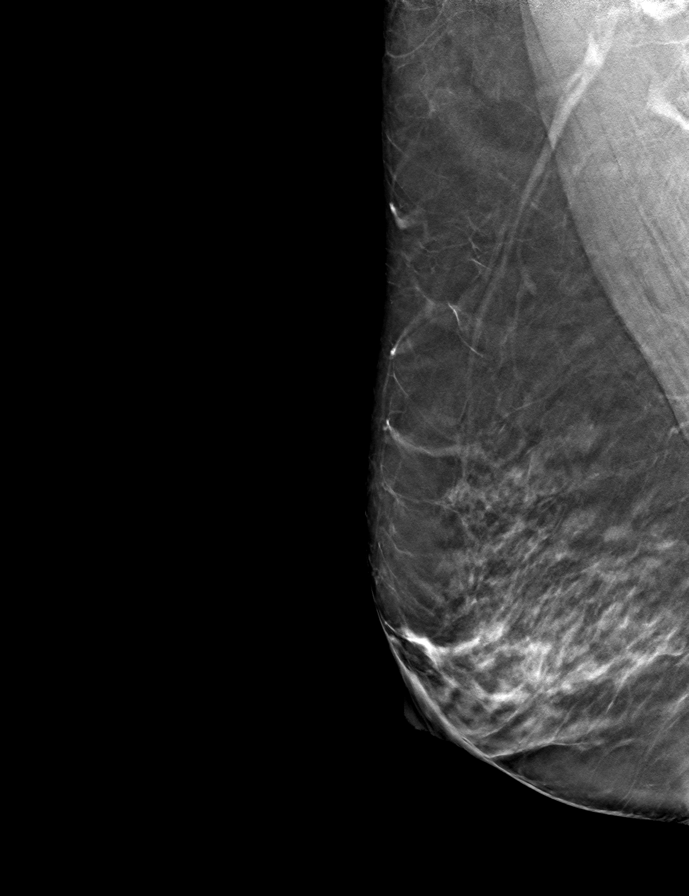

[L CC tomo · tomo slice 34/67.0]
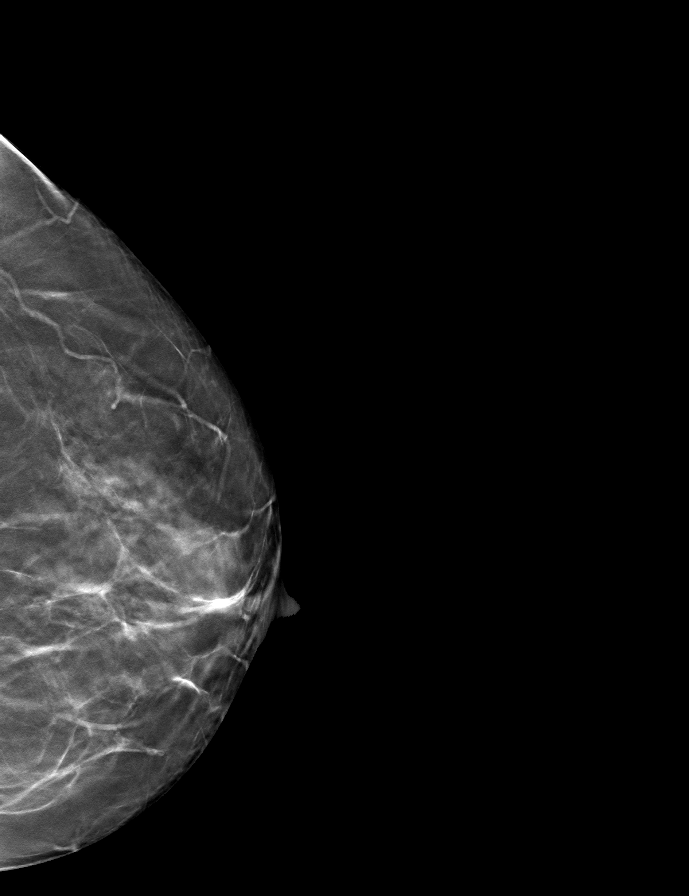

[9 of 24 positions shown; findings below may reference images not displayed]

ACR Breast Density Category c: The breast tissue is heterogeneously
dense, which may obscure small masses
FINDINGS: There are no findings suspicious for malignancy.
IMPRESSION: No mammographic evidence of malignancy. A result letter of this
screening mammogram will be mailed directly to the patient.

RECOMMENDATION:
Screening mammogram in one year. (Code:C8-T-HNK)

BI-RADS CATEGORY  1: Negative.

## 2023-04-30 ENCOUNTER — Ambulatory Visit: Payer: Self-pay | Admitting: Internal Medicine

## 2023-04-30 ENCOUNTER — Encounter: Payer: Self-pay | Admitting: Internal Medicine

## 2023-04-30 VITALS — BP 130/80 | HR 88 | Resp 16 | Ht 61.5 in | Wt 123.0 lb

## 2023-04-30 DIAGNOSIS — Z0000 Encounter for general adult medical examination without abnormal findings: Secondary | ICD-10-CM

## 2023-04-30 DIAGNOSIS — I10 Essential (primary) hypertension: Secondary | ICD-10-CM

## 2023-04-30 NOTE — Progress Notes (Signed)
 Subjective:    Patient ID: Courtney Daugherty, female   DOB: 1971-10-18, 52 y.o.   MRN: [id number]   HPI  Germaine Kohut interprets  CPE without pap  1.  Pap:  Last 03/18/2021 with ASCUS and negative HPV.    2.  Mammogram:  Last 12/08/2022 and normal.    3.  Osteoprevention:  2 servings of yogurt and cheese with meals once daily.  She is outside to walk daily.  Walks 1.5 hours daily.    4.  Guaiac Cards/FIT:  Last 12/2022 and negative--done with [facility] program.    5.  Colonoscopy:  Never.  No family history of colon cancer.    6.  Immunizations:  Did not obtain influenza vaccine this past season nor up to date COVID vaccine. Immunization History  Administered Date(s) Administered   Influenza Inj Mdck Quad Pf 10/10/2021   Moderna Covid-19 Vaccine Bivalent Booster 77yrs & up 04/10/2022   PFIZER(Purple Top)SARS-COV-2 Vaccination 01/20/2020, 02/16/2020, 09/17/2020   Tdap 10/10/2021   Zoster Recombinat (Shingrix) 04/23/2022, 11/17/2022     7.  Glucose/Cholesterol:  Gluicose and cholesterol panels were recently fine at [facility]. Lipid Panel     Component Value Date/Time   CHOL 168 04/14/2023 0855   TRIG 93 04/14/2023 0855   HDL 70 04/14/2023 0855   CHOLHDL 2.4 04/14/2023 0855   LDLCALC 81 04/14/2023 0855   LABVLDL 17 04/14/2023 0855     Current Meds  Medication Sig   amLODipine (NORVASC) 10 MG tablet 1 tab by mouth daily.   ascorbic acid (VITAMIN C) 500 MG tablet Take 500 mg by mouth daily.   Calcium Carb-Cholecalciferol (CALCIUM 600 + D PO) Take by mouth daily.   ibuprofen (ADVIL) 600 MG tablet Take 600 mg by mouth every 6 (six) hours as needed for headache.   No Known Allergies  Past Medical History:  Diagnosis Date   Hypertension    History reviewed. No pertinent surgical history.  Family History  Problem Relation Age of Onset   Diabetes Mother        complication of what sounds like BKA   Hypertension Mother    Heart disease Father     Heart attack Father    Breast cancer Neg Hx    Social History   Socioeconomic History   Marital status: Married    Spouse name: Rayfield Cairo   Number of children: 3   Years of education: Not on file   Highest education level: High school graduate  Occupational History   Occupation: Housewife  Tobacco Use   Smoking status: Never    Passive exposure: Never   Smokeless tobacco: Never  Vaping Use   Vaping status: Never Used  Substance and Sexual Activity   Alcohol use: Yes    Comment: rare   Drug use: Never   Sexual activity: Yes    Birth control/protection: Post-menopausal  Other Topics Concern   Not on file  Social History Narrative   Lives at home with her husband, 2 children and her in laws.     Cares for 3 of her grandchildren most days in the afternoon.   Social Drivers of Corporate investment banker Strain: Low Risk  (10/10/2021)   Overall Financial Resource Strain (CARDIA)    Difficulty of Paying Living Expenses: Not hard at all  Food Insecurity: No Food Insecurity (04/30/2023)   Hunger Vital Sign    Worried About Running Out of Food in the Last Year: Never true  Ran Out of Food in the Last Year: Never true  Transportation Needs: No Transportation Needs (04/30/2023)   PRAPARE - Administrator, Civil Service (Medical): No    Lack of Transportation (Non-Medical): No  Physical Activity: Not on file  Stress: Not on file  Social Connections: Not on file  Intimate Partner Violence: Not At Risk (04/30/2023)   Humiliation, Afraid, Rape, and Kick questionnaire    Fear of Current or Ex-Partner: No    Emotionally Abused: No    Physically Abused: No    Sexually Abused: No      Review of Systems  Respiratory:  Negative for shortness of breath.   Cardiovascular:  Positive for leg swelling (bilateral ankles starts in afternoon after on feet for a while.  Back to normal in the morning.). Negative for chest pain.  Skin:        Skin lesions on right wrist and  nose.      Objective:   BP 130/80 (BP Location: Left Arm, Patient Position: Sitting, Cuff Size: Normal)   Pulse 88   Resp 16   Ht 5' 1.5" (1.562 m)   Wt 123 lb (55.8 kg)   LMP 02/16/2021 (Exact Date) Comment: Periods were always regular and every month prior to April, even with IUD in place.  BMI 22.86 kg/m   Physical Exam Constitutional:      Appearance: She is normal weight.  HENT:     Head: Normocephalic and atraumatic.     Right Ear: Tympanic membrane, ear canal and external ear normal.     Left Ear: Tympanic membrane, ear canal and external ear normal.     Nose: Nose normal.     Mouth/Throat:     Mouth: Mucous membranes are moist.     Pharynx: Oropharynx is clear.  Eyes:     Extraocular Movements: Extraocular movements intact.     Pupils: Pupils are equal, round, and reactive to light.     Comments: Discs sharp  Neck:     Thyroid: No thyroid mass or thyromegaly.  Cardiovascular:     Rate and Rhythm: Normal rate and regular rhythm.     Heart sounds: S1 normal and S2 normal. No murmur heard.    No friction rub. No S3 or S4 sounds.     Comments: No carotid bruits.  Carotid, radial, femoral, DP and PT pulses normal and equal.   Pulmonary:     Effort: Pulmonary effort is normal.     Breath sounds: Normal breath sounds and air entry.  Chest:     Comments: Deferred as had with [facility] exam earlier in year. Abdominal:     General: Abdomen is flat. Bowel sounds are normal.     Palpations: Abdomen is soft. There is no hepatomegaly, splenomegaly or mass.     Hernia: No hernia is present.  Genitourinary:    Comments: Deferred as done earlier in year with [facility] exam.  Musculoskeletal:        General: Normal range of motion.     Cervical back: Normal range of motion and neck supple.     Right lower leg: No edema.     Left lower leg: No edema.  Feet:     Right foot:     Skin integrity: Skin integrity normal.     Left foot:     Skin integrity: Skin integrity  normal.  Lymphadenopathy:     Head:     Right side of head: No  submental or submandibular adenopathy.     Left side of head: No submental or submandibular adenopathy.     Cervical: No cervical adenopathy.     Upper Body:     Right upper body: No supraclavicular or axillary adenopathy.     Left upper body: No supraclavicular or axillary adenopathy.     Lower Body: No right inguinal adenopathy. No left inguinal adenopathy.  Skin:    General: Skin is warm.     Capillary Refill: Capillary refill takes less than 2 seconds.     Comments: Dark blue dome lesion with one hair follicle in center 5 mm in diameter, well circumscribed. Freckling of face and side of nose.   Neurological:     General: No focal deficit present.     Mental Status: She is alert and oriented to person, place, and time.     Cranial Nerves: Cranial nerves 2-12 are intact.     Sensory: Sensation is intact.     Motor: Motor function is intact.     Coordination: Coordination is intact.     Gait: Gait is intact.     Deep Tendon Reflexes: Reflexes are normal and symmetric.  Psychiatric:        Mood and Affect: Mood normal.        Behavior: Behavior normal.      Assessment & Plan  CPE without pap Mammogram in January 2025 FIT to return in 2 weeks CMP, CBC.    2.  Hypertension: controlled.

## 2023-05-01 LAB — CBC WITH DIFFERENTIAL/PLATELET
Basophils Absolute: 0 10*3/uL (ref 0.0–0.2)
Basos: 0 %
EOS (ABSOLUTE): 0 10*3/uL (ref 0.0–0.4)
Eos: 0 %
Hematocrit: 40.2 % (ref 34.0–46.6)
Hemoglobin: 13.6 g/dL (ref 11.1–15.9)
Immature Grans (Abs): 0 10*3/uL (ref 0.0–0.1)
Immature Granulocytes: 0 %
Lymphocytes Absolute: 1.8 10*3/uL (ref 0.7–3.1)
Lymphs: 22 %
MCH: 31.6 pg (ref 26.6–33.0)
MCHC: 33.8 g/dL (ref 31.5–35.7)
MCV: 94 fL (ref 79–97)
Monocytes Absolute: 0.4 10*3/uL (ref 0.1–0.9)
Monocytes: 4 %
Neutrophils Absolute: 5.9 10*3/uL (ref 1.4–7.0)
Neutrophils: 74 %
Platelets: 271 10*3/uL (ref 150–450)
RBC: 4.3 x10E6/uL (ref 3.77–5.28)
RDW: 12.6 % (ref 11.7–15.4)
WBC: 8.1 10*3/uL (ref 3.4–10.8)

## 2023-05-01 LAB — COMPREHENSIVE METABOLIC PANEL
ALT: 17 IU/L (ref 0–32)
AST: 24 IU/L (ref 0–40)
Albumin: 4.7 g/dL (ref 3.8–4.9)
Alkaline Phosphatase: 95 IU/L (ref 44–121)
BUN/Creatinine Ratio: 28 — ABNORMAL HIGH (ref 9–23)
BUN: 14 mg/dL (ref 6–24)
Bilirubin Total: 0.4 mg/dL (ref 0.0–1.2)
CO2: 21 mmol/L (ref 20–29)
Calcium: 9.3 mg/dL (ref 8.7–10.2)
Chloride: 105 mmol/L (ref 96–106)
Creatinine, Ser: 0.5 mg/dL — ABNORMAL LOW (ref 0.57–1.00)
Globulin, Total: 2.4 g/dL (ref 1.5–4.5)
Glucose: 74 mg/dL (ref 70–99)
Potassium: 3.3 mmol/L — ABNORMAL LOW (ref 3.5–5.2)
Sodium: 143 mmol/L (ref 134–144)
Total Protein: 7.1 g/dL (ref 6.0–8.5)
eGFR: 113 mL/min/{1.73_m2} (ref >59)

## 2023-11-01 ENCOUNTER — Ambulatory Visit: Payer: Self-pay | Admitting: Internal Medicine

## 2023-12-06 ENCOUNTER — Ambulatory Visit: Payer: Self-pay | Admitting: Internal Medicine

## 2023-12-31 ENCOUNTER — Other Ambulatory Visit: Payer: Self-pay | Admitting: Internal Medicine

## 2024-02-04 ENCOUNTER — Telehealth: Payer: Self-pay

## 2024-02-04 NOTE — Telephone Encounter (Signed)
 Health Coaching 3  interpreter- Natale Lay, [facility]  Patient's labs were normal at screening. Patient's BP was boderline at 142/80. Patient FU with her PCP after initial screening. Patient was prescribed amlodipine by her PCP. Patient is taking her medication daily as directed. Patient is still exercising regularly. Patient has also been watching her diet and has been trying to limit the amount of sodium that she consumes.    Navigation:  Patient is aware of  a follow up session. Patient is scheduled for FU on 03/08/24 @ 1:00 pm   Time- 15 minutes

## 2024-02-22 ENCOUNTER — Ambulatory Visit: Payer: Self-pay | Admitting: Internal Medicine

## 2024-02-22 ENCOUNTER — Encounter: Payer: Self-pay | Admitting: Internal Medicine

## 2024-02-22 VITALS — BP 128/70 | HR 78 | Resp 16 | Ht 61.5 in | Wt 124.0 lb

## 2024-02-22 DIAGNOSIS — R300 Dysuria: Secondary | ICD-10-CM

## 2024-02-22 LAB — POCT URINALYSIS DIPSTICK
Bilirubin, UA: NEGATIVE
Blood, UA: NEGATIVE
Glucose, UA: NEGATIVE
Leukocytes, UA: NEGATIVE
Nitrite, UA: NEGATIVE
Protein, UA: NEGATIVE
Spec Grav, UA: 1.01 (ref 1.010–1.025)
Urobilinogen, UA: 0.2 U/dL
pH, UA: 6.5 (ref 5.0–8.0)

## 2024-02-22 MED ORDER — LISINOPRIL 10 MG PO TABS: 10.0000 mg | ORAL_TABLET | Freq: Every day | ORAL | 11 refills | Status: AC

## 2024-02-22 NOTE — Progress Notes (Signed)
    Subjective:    Patient ID: Courtney Daugherty, female   DOB: Apr 27, 1971, 53 y.o.   MRN: [id number]   HPI  Courtney Daugherty interprets   Hand joint pain :  feels amlodipine has been causing joint pain of fingers and hands for past 3 months.  States stopped the medication about 3 weeks ago for 1 week and the pain resolved.  She restarted the medication and her pain recurred.    2.  Mild burning on urination:  Started today and low back pain started yesterday.  She has had recurrent UTIs in past--last end of 2022 and beginning of 2023.  UA today is normal, but has been so in past with UTI.    Current Meds  Medication Sig   amLODipine (NORVASC) 10 MG tablet Take 1 tablet by mouth once daily   ascorbic acid (VITAMIN C) 500 MG tablet Take 500 mg by mouth daily.   Calcium Carb-Cholecalciferol (CALCIUM 600 + D PO) Take by mouth daily.   ibuprofen (ADVIL) 600 MG tablet Take 600 mg by mouth every 6 (six) hours as needed for headache.   No Known Allergies   Review of Systems    Objective:   BP 128/70 (BP Location: Right Arm, Patient Position: Sitting, Cuff Size: Normal)   Pulse 78   Resp 16   Ht 5' 1.5" (1.562 m)   Wt 124 lb (56.2 kg)   LMP 02/16/2021 (Exact Date) Comment: Periods were always regular and every month prior to April, even with IUD in place.  BMI 23.05 kg/m   Physical Exam NAD Lungs:  CTA CV:  RRR without murmur or rub.  Radial pulses normal and equal Abd:  S, No flank tenderness.  Mild bilateral quadrant and suprapubic tenderness.  + BS.  No HSM or mass. Hands:  no joint swelling or erythema.   Assessment & Plan   Joint pain of hands with Amlodipine.  Switch to Lisinopril 10 mg with BMP and BPcheck in 2 weeks.   2.  Dysuria:  UA normal, but with symptoms, will send urine for culture.

## 2024-02-22 NOTE — Patient Instructions (Signed)
 Push water every day

## 2024-02-24 LAB — URINE CULTURE

## 2024-03-07 NOTE — Progress Notes (Unsigned)
 [facility]woman follow up   Interpreter: Herma Longest, Lenis Quin  Clinical Measurement:   Vitals:   03/08/24 1254 03/08/24 1514  BP: (!) 155/80 (!) 143/72      Medical History: Patient states that she does not have high cholesterol, has high blood pressure and she does not have diabetes. Patient states that she does not have history of gestational hypertension, does not have history of pre-eclampsia/eclampsia and she does not have history of gestational diabetes.     Medications: Patient states that she does take medication to lower cholesterol, blood pressure and blood sugar.  Patient does not take an aspirin a day to help prevent a heart attack or stroke. During the past 7 days patient has taken prescribed medication to lower blood pressure on 7 days.   Blood pressure, self measurement: Patient states that she does measure blood pressure from home. She checks her blood pressure monthly. She shares her readings with a health care provider: no.   Nutrition: Patient states that on average she eats 2 cups of fruit and 1 cups of vegetables per day. Patient states that she does eat fish at least 2 times per week. Patient eats more than half servings of whole grains. Patient drinks less than 36 ounces of beverages with added sugar weekly: yes. Patient is currently watching sodium or salt intake: yes. In the past 7 days patient has had 0 drinks containing alcohol. On average patient drinks 0 drinks containing alcohol per day.      Physical activity: Patient states that she gets 150 minutes of moderate and 150 minutes of vigorous physical activity each week.  Smoking status: Patient states that she has has never smoked .   Quality of life: Over the past 2 weeks patient states that she had little interest or pleasure in doing things: not at all. She has been feeling down, depressed or hopeless:not at all.   Social Determinants of Health Assessment:   Computer Use: During the last 12 months patient states  that she has used any of the following: desktop/laptop, smart phone or tablet/other portable wireless computer: yes.   Internet Use: During the last 12 months, did you or any member of your household have access to the internet: Yes, by paying a cell phone company or internet service provider.   Food Insecurities: During the last 12 months, where there any times when you were worried that you would run out of food because of a lack of money or other resources: No.   Transportation Barriers: During the last 12 months, have you missed a doctor's appointment because of transportation problems: No.   Childcare Barriers: If you are currently using childcare services, please identify  the type of services you use. (If not using childcare services, please select "Not applicable"): not applicable. During the last 12 months, have you had any barriers to childcare services such as: not applicable.   Housing: What is your housing situation today: I have housing.   Intimate Partner Violence: During the last 12 months, how often did your partner physically hurt you: never. During the last 12 months, how often did your partner insult you or talk down to you: never.  Medication Adherence: During the last 12 months, did you ever forget to take your medicine: No. During the last 12 months, were you careless ar times about taking your medicine: No. During the last 12 months, when you felt better did you sometimes stop taking your medication: No. During the last 12 months, sometimes if  you felt worse when you took your medicine did you stop taking it: No.    Risk reduction and counseling: Encouraged patient to continue practicing a heart healthy diet. Patient has been walking or running x5 weekly for 60-90 minutes.     Navigation: This was the  follow up session for this patient, I will check up on her progress in the coming months.

## 2024-03-08 ENCOUNTER — Other Ambulatory Visit: Payer: Self-pay

## 2024-03-08 ENCOUNTER — Inpatient Hospital Stay: Attending: Obstetrics and Gynecology | Admitting: *Deleted

## 2024-03-08 VITALS — BP 143/72 | Ht 63.0 in | Wt 124.3 lb

## 2024-03-08 DIAGNOSIS — Z1231 Encounter for screening mammogram for malignant neoplasm of breast: Secondary | ICD-10-CM

## 2024-03-08 DIAGNOSIS — Z0000 Encounter for general adult medical examination without abnormal findings: Secondary | ICD-10-CM

## 2024-03-09 ENCOUNTER — Other Ambulatory Visit: Payer: Self-pay

## 2024-03-28 ENCOUNTER — Ambulatory Visit: Payer: Self-pay | Admitting: *Deleted

## 2024-03-28 ENCOUNTER — Ambulatory Visit
Admission: RE | Admit: 2024-03-28 | Discharge: 2024-03-28 | Disposition: A | Discharge status: Home / Self Care | Source: Ambulatory Visit | Attending: Internal Medicine | Admitting: Internal Medicine

## 2024-03-28 VITALS — BP 139/62 | Wt 124.0 lb

## 2024-03-28 DIAGNOSIS — Z1231 Encounter for screening mammogram for malignant neoplasm of breast: Secondary | ICD-10-CM

## 2024-03-28 DIAGNOSIS — Z01419 Encounter for gynecological examination (general) (routine) without abnormal findings: Secondary | ICD-10-CM

## 2024-03-28 DIAGNOSIS — Z1211 Encounter for screening for malignant neoplasm of colon: Secondary | ICD-10-CM

## 2024-03-28 NOTE — Patient Instructions (Signed)
 Explained breast self awareness with [employer]. Pap smear completed today. Let her know [facility] will cover Pap smears and HPV typing every 5 years unless has a history of abnormal Pap smears. Referred patient to the [facility] for a screening mammogram on mobile unit. Appointment scheduled Tuesday, Mar 28, 2024 at 0940. Patient aware of appointment and will be there. Let patient know will follow up with her within the next couple weeks with results of Pap smear by letter or phone. Informed patient that the [facility] will follow up with her within the next couple of weeks with results of her mammogram by letter or phone. [employer] verbalized understanding.  Timohty Renbarger, Dela Favor, RN 9:09 AM

## 2024-03-28 NOTE — Progress Notes (Signed)
 Ms. Courtney Daugherty is a 53 y.o. G3P0 female who presents to [facility] clinic today with no complaints.    Pap Smear: Pap smear completed today. Last Pap smear was 03/18/2021 at the free cervical cancer screening clinic and was abnormal - ASCUS with negative HPV. Per patient has no history of an abnormal Pap smear prior to her most recent Pap smear. Last Pap smear result is available in Epic.    Physical exam: Breasts Breasts symmetrical. No skin abnormalities bilateral breasts. No nipple retraction bilateral breasts. No nipple discharge bilateral breasts. No lymphadenopathy left axilla. Right axillary lymphadenopathy. No lumps palpated bilateral breasts. Unable to palpate a lump in right axilla. Patients area of concern of lump is right upper arm and was noted on previous exam 01/16/2021 and 12/08/2022. Right breast ultrasound was completed for follow up 01/16/2021 that was negative. No complaints of pain or tenderness on exam.     MS DIGITAL SCREENING TOMO BILATERAL Result Date: 12/16/2022 CLINICAL DATA:  Screening. EXAM: DIGITAL SCREENING BILATERAL MAMMOGRAM WITH TOMOSYNTHESIS AND CAD TECHNIQUE: Bilateral screening digital craniocaudal and mediolateral oblique mammograms were obtained. Bilateral screening digital breast tomosynthesis was performed. The images were evaluated with computer-aided detection. COMPARISON:  Previous exam(s). ACR Breast Density Category b: There are scattered areas of fibroglandular density. FINDINGS: There are no findings suspicious for malignancy. IMPRESSION: No mammographic evidence of malignancy. A result letter of this screening mammogram will be mailed directly to the patient. RECOMMENDATION: Screening mammogram in one year. (Code:SM-B-01Y) BI-RADS CATEGORY  1: Negative. Electronically Signed   By: Roda Cirri M.D.   On: 12/16/2022 07:57   MM 3D SCREEN BREAST BILATERAL Result Date: 09/29/2021 CLINICAL DATA:  Screening. EXAM: DIGITAL SCREENING BILATERAL MAMMOGRAM  WITH TOMOSYNTHESIS AND CAD TECHNIQUE: Bilateral screening digital craniocaudal and mediolateral oblique mammograms were obtained. Bilateral screening digital breast tomosynthesis was performed. The images were evaluated with computer-aided detection. COMPARISON:  None. ACR Breast Density Category c: The breast tissue is heterogeneously dense, which may obscure small masses FINDINGS: There are no findings suspicious for malignancy. IMPRESSION: No mammographic evidence of malignancy. A result letter of this screening mammogram will be mailed directly to the patient. RECOMMENDATION: Screening mammogram in one year. (Code:SM-B-01Y) BI-RADS CATEGORY  1: Negative. Electronically Signed   By: Alinda Apley M.D.   On: 09/29/2021 07:48   MS DIGITAL DIAG TOMO UNI RIGHT Result Date: 09/17/2020 CLINICAL DATA:  Patient returns after screening study for evaluation of possible RIGHT breast asymmetry. EXAM: DIGITAL DIAGNOSTIC UNILATERAL RIGHT MAMMOGRAM WITH TOMO AND CAD COMPARISON:  Previous exam(s). ACR Breast Density Category c: The breast tissue is heterogeneously dense, which may obscure small masses. FINDINGS: Additional 2-D and 3-D images are performed. These views show no persistent mass or asymmetry in the superior or mid portion of the RIGHT breast. Mammographic images were processed with CAD. IMPRESSION: No mammographic evidence for malignancy. RECOMMENDATION: Screening mammogram in one year.(Code:SM-B-01Y) I have discussed the findings and recommendations with the patient and mother. If applicable, a reminder letter will be sent to the patient regarding the next appointment. BI-RADS CATEGORY  1: Negative. Electronically Signed   By: Anitra Barn M.D.   On: 09/17/2020 09:18   MS DIGITAL SCREENING TOMO BILATERAL Result Date: 08/30/2020 CLINICAL DATA:  Screening. EXAM: DIGITAL SCREENING BILATERAL MAMMOGRAM WITH TOMO AND CAD COMPARISON:  Previous exam(s). ACR Breast Density Category c: The breast tissue is  heterogeneously dense, which may obscure small masses. FINDINGS: In the right breast, a possible asymmetry warrants further evaluation. In  the left breast, no findings suspicious for malignancy. Images were processed with CAD. IMPRESSION: Further evaluation is suggested for possible asymmetry in the right breast. RECOMMENDATION: Diagnostic mammogram and possibly ultrasound of the right breast. (Code:[id number]-66M) The patient will be contacted regarding the findings, and additional imaging will be scheduled. BI-RADS CATEGORY  0: Incomplete. Need additional imaging evaluation and/or prior mammograms for comparison. Electronically Signed   By: Anitra Barn M.D.   On: 08/30/2020 13:29   MS DIGITAL SCREENING TOMO BILATERAL Result Date: 07/12/2019 CLINICAL DATA:  Screening. EXAM: DIGITAL SCREENING BILATERAL MAMMOGRAM WITH TOMO AND CAD COMPARISON:  Previous exam(s). ACR Breast Density Category c: The breast tissue is heterogeneously dense, which may obscure small masses. FINDINGS: There are no findings suspicious for malignancy. Images were processed with CAD. IMPRESSION: No mammographic evidence of malignancy. A result letter of this screening mammogram will be mailed directly to the patient. RECOMMENDATION: Screening mammogram in one year. (Code:SM-B-01Y) BI-RADS CATEGORY  1: Negative. Electronically Signed   By: Carry Clapper M.D.   On: 07/12/2019 11:34    Pelvic/Bimanual Ext Genitalia No lesions, no swelling and no discharge observed on external genitalia.        Vagina Vagina pink and normal texture. No lesions or discharge observed in vagina.        Cervix Cervix is present. Cervix pink and of normal texture. No discharge observed.    Uterus Uterus is present and palpable. Uterus in normal position and normal size.        Adnexae Bilateral ovaries present and palpable. No tenderness on palpation.         Rectovaginal No rectal exam completed today since patient had no rectal complaints. No skin  abnormalities observed on exam.     Smoking History: Patient has never smoked.   Patient Navigation: Patient education provided. Access to services provided for patient through [facility] program. Spanish interpreter Herma Longest from [facility] provided.   Colorectal Cancer Screening: Per patient has never had colonoscopy completed. Patient completed FIT Test 12/29/2022 and negative. FIT Test given to patient to complete. No complaints today.    Breast and Cervical Cancer Risk Assessment: Patient does not have family history of breast cancer, known genetic mutations, or radiation treatment to the chest before age 23. Patient does not have history of cervical dysplasia, immunocompromised, or DES exposure in-utero.   Risk Scores as of Encounter on 03/28/2024     [facility]           5-year 0.44%   Lifetime 4.05%            Last calculated by Silas, Ansyi K, CMA on 03/28/2024 at  8:54 AM        A: [facility] exam with pap smear No complaints.  P: Referred patient to the [facility] for a screening mammogram on mobile unit. Appointment scheduled Tuesday, Mar 28, 2024 at 0940.   Stefan Edge, RN 03/28/2024 9:09 AM

## 2024-03-31 LAB — CYTOLOGY - PAP
Comment: NEGATIVE
Diagnosis: NEGATIVE
High risk HPV: NEGATIVE

## 2024-04-04 ENCOUNTER — Ambulatory Visit: Payer: Self-pay

## 2024-05-03 ENCOUNTER — Other Ambulatory Visit: Payer: Self-pay

## 2024-05-03 ENCOUNTER — Ambulatory Visit: Payer: Self-pay | Admitting: Internal Medicine

## 2024-05-03 ENCOUNTER — Inpatient Hospital Stay: Attending: Obstetrics and Gynecology | Admitting: *Deleted

## 2024-05-03 ENCOUNTER — Encounter: Payer: Self-pay | Admitting: Internal Medicine

## 2024-05-03 VITALS — BP 134/74 | Ht 63.0 in | Wt 123.6 lb

## 2024-05-03 VITALS — BP 138/70 | HR 86 | Resp 12 | Ht 62.5 in | Wt 123.5 lb

## 2024-05-03 DIAGNOSIS — Z0000 Encounter for general adult medical examination without abnormal findings: Secondary | ICD-10-CM

## 2024-05-03 DIAGNOSIS — I10 Essential (primary) hypertension: Secondary | ICD-10-CM

## 2024-05-03 MED ORDER — METOPROLOL SUCCINATE ER 25 MG PO TB24: 25.0000 mg | ORAL_TABLET | Freq: Every day | ORAL | 11 refills | Status: AC

## 2024-05-03 NOTE — Progress Notes (Signed)
 [facility] Re-screening   Interpreter- Bernice Angry, [place]   Clinical Measurement:  Vitals:   05/03/24 0842 05/03/24 1305  BP: 135/76 134/74   Fasting Labs Drawn Today, will review with patient when they result.   Medical History: Patient states that she does not have high cholesterol, has high blood pressure and she does not have diabetes. Patient states that she does not have history of gestational hypertension, does not have history of pre-eclampsia/eclampsia and she does not have history of gestational diabetes.    Medications: Patient states that she does take medication to lower cholesterol, blood pressure or blood sugar.  Patient does not take an aspirin a day to help prevent a heart attack or stroke. During the past 7 days patient has taken prescribed medication to lower blood pressure on 7 days.   Blood pressure, self measurement: Patient states that she does measure blood pressure from home. She checks her blood pressure weekly. She shares her readings with a health care provider: no.   Nutrition: Patient states that on average she eats 1 cups of fruit and 0 cups of vegetables per day. Patient states that she does eat fish at least 2 times per week. Patient eats less than half servings of whole grains. Patient drinks less than 36 ounces of beverages with added sugar weekly: yes. Patient is currently watching sodium or salt intake: yes. In the past 7 days patient has consumed drinks containing alcohol on 0 days. On a day that patient consumes drinks containing alcohol on average 0 drinks are consumed.      Physical activity: Patient states that she gets 450 minutes of moderate and 0 minutes of vigorous physical activity each week.  Smoking status: Patient states that she has has never smoked .   Quality of life: Over the past 2 weeks patient states that she had little interest or pleasure in doing things: not at all. She has been feeling down, depressed or hopeless:not at all.    Social Determinants of Health Assessment:   Computer Use: During the last 12 months patient states that she has used any of the following: desktop/laptop, smart phone or tablet/other portable wireless computer: yes.   Internet Use: During the last 12 months, did you or any member of your household have access to the internet: Yes, by paying a cell phone company or internet service provider.   Food Insecurities: During the last 12 months, where there any times when you were worried that you would run out of food because of a lack of money or other resources: No.   Transportation Barriers: During the last 12 months, have you missed a doctor's appointment because of transportation problems: No.   Childcare Barriers: If you are currently using childcare services, please identify  the type of services you use. (If not using childcare services, please select Not applicable): not applicable. During the last 12 months, have you had any barriers to childcare services such as: not applicable.   Housing: What is your housing situation today: I have housing.   Intimate Partner Violence: During the last 12 months, how often did your partner physically hurt you: never. During the last 12 months, how often did your partner insult you or talk down to you: never.  Medication Adherence: During the last 12 months, did you ever forget to take your medicine: No. During the last 12 months, were you careless ar times about taking your medicine: No. During the last 12 months, when you felt better did you  sometimes stop taking your medication: No. During the last 12 months, sometimes if you felt worse when you took your medicine did you stop taking it: No.   Risk reduction and counseling:   Health Coaching: Spoke with patient about the daily recommendation for fruits and vegetables. Showed patient what a serving size would look like. Patient consumes fish twice a week. Patient consumes oatmeal and brown rice at  times. Spoke with patient about adding more whole grains into her regular diet. Patient has been walking or running 5 days per week for 90 minutes. Encouraged patient to keep up with her exercising and to continue to take her BP meds as prescribed.   Goal: Patient will increase fruit and vegetable intake to one serving of each daily. Patient will work on reaching this goal over the next month.   Navigation:  I will notify patient of lab results.  Patient is aware of 2 more health coaching sessions and a follow up.

## 2024-05-03 NOTE — Progress Notes (Unsigned)
 Subjective:    Patient ID: Courtney Daugherty, female   DOB: Apr 18, 1971, 53 y.o.   MRN: [id number]   HPI  Erminio Bloomer interprets  CPE without pap  1.  Pap:  Goes to [facility].  Pap on 5/20.2025 normal this time without ASCUS and HPV negative.  2.  Mammogram:  Mammogram normal also on 03/28/2024 with [facility].  3.  Osteoprevention:  Taking Calcium and vitamin D regularly, adequate amounts.  Walks 5 days weekly for 1 hour and may run as well.    4.  Guaiac Cards/FIT:  Last checked 12/29/2022 and negative.  She did take her FIT back to [facility] 2 days ago, but do not see a result yet.    5.  Colonoscopy:  Never.  No family history of colon cancer.   6.  Immunizations:  Has not had COVID vaccination for the 2024-2025 year.   Immunization History  Administered Date(s) Administered   Influenza Inj Mdck Quad Pf 10/10/2021   Moderna Covid-19 Vaccine  Bivalent Booster 5yrs & up 04/10/2022   PFIZER(Purple Top)SARS-COV-2 Vaccination 01/20/2020, 02/16/2020, 09/17/2020   Tdap 10/10/2021   Zoster Recombinant(Shingrix ) 04/23/2022, 11/17/2022     7.  Glucose/Cholesterol:  Glucose fine recently and A1C last year was 5.3%  Cholesterol fine last year.  For some reason, A1C and cholesterol checked again with [facility] today.    Current Meds  Medication Sig   ascorbic acid (VITAMIN C) 500 MG tablet Take 500 mg by mouth daily.   calcium carbonate (CALCIUM 600) 600 MG TABS tablet Take 600 mg by mouth 2 (two) times daily with a meal.   Cholecalciferol (VITAMIN D3) 1000 units CAPS Take 1,000 Units by mouth daily.   ibuprofen (ADVIL) 600 MG tablet Take 600 mg by mouth every 6 (six) hours as needed for headache.   lisinopril  (ZESTRIL ) 10 MG tablet Take 1 tablet (10 mg total) by mouth daily.   Multiple Vitamin (MULTIVITAMIN PO) Take 1 tablet by mouth daily with breakfast.   No Known Allergies   Review of Systems  Respiratory:  Negative for shortness of breath.    Cardiovascular:  Negative for chest pain, palpitations and leg swelling.  Neurological:  Positive for headaches (Relates to taking Lisinopril .  Feels she has the headache starting 30 minutes after taking Lisinopril .  Points to temple area bilaterally. Lasts about 4 hours and then goes away.  BP is 138/ 77.  Has just noted this the past 3-4 weeks. started in April.SABRA).      Objective:   BP 138/70 (BP Location: Right Arm, Patient Position: Sitting, Cuff Size: Normal)   Pulse 86   Resp 12   Ht 5' 2.5 (1.588 m)   Wt 123 lb 8 oz (56 kg)   LMP 02/16/2021 (Exact Date) Comment: Periods were always regular and every month prior to April, even with IUD in place.  BMI 22.23 kg/m   Physical Exam Exam normal   Assessment & Plan   CPE without pap Recent pap and mammogram normal Encouraged her to continue getting her physical with [facility] and stop with us  as duplicating services and not necessary, plus extra unneeded cost.   Encouraged COVID vaccination.  She would like to wait until fall for new COVID and influenza.   Await FIT results from [facility] as well as A1C and cholesterol panel.  Hers have been fine in past.    2.  Hypertension:  adequate control.  With her low potassium recently at  3.3, suggested avocado, orange, banana

## 2024-05-04 LAB — LIPID PANEL
Chol/HDL Ratio: 2.8 ratio (ref 0.0–4.4)
Cholesterol, Total: 176 mg/dL (ref 100–199)
HDL: 64 mg/dL (ref >39)
LDL Chol Calc (NIH): 97 mg/dL (ref 0–99)
Triglycerides: 79 mg/dL (ref 0–149)
VLDL Cholesterol Cal: 15 mg/dL (ref 5–40)

## 2024-05-04 LAB — HEMOGLOBIN A1C
Est. average glucose Bld gHb Est-mCnc: 103 mg/dL
Hgb A1c MFr Bld: 5.2 % (ref 4.8–5.6)

## 2024-05-04 LAB — GLUCOSE, RANDOM: Glucose: 95 mg/dL (ref 70–99)

## 2024-05-06 LAB — FECAL OCCULT BLOOD, IMMUNOCHEMICAL: Fecal Occult Bld: NEGATIVE

## 2024-05-11 ENCOUNTER — Ambulatory Visit: Payer: Self-pay

## 2024-05-11 NOTE — Telephone Encounter (Signed)
 Left message for patient via [facility] [employer] [phone] about lab results from [facility]. Left name and number for patient to call back.

## 2024-05-15 ENCOUNTER — Telehealth: Payer: Self-pay

## 2024-05-15 NOTE — Telephone Encounter (Signed)
 Health coaching 2   interpreter- [employer] [phone]   Labs- 176 cholesterol, 97 LDL cholesterol, 79 triglycerides, 64 HDL cholesterol, 5.2 hemoglobin A1C, 95 mean plasma glucose. Patient understands and is aware of her lab results.   Goals- 1. Continue practicing heart healthy diet. Watching the amount of fried an fatty foods consumed. Continue trying to add fruits and vegetables into daily diet. 2. Continue with regular physical activity. 3. Continue taking BP medication as prescribed.   Navigation:  Patient is aware of 1 more health coaching sessions and a follow up.

## 2024-05-17 ENCOUNTER — Other Ambulatory Visit: Payer: Self-pay

## 2024-06-02 ENCOUNTER — Other Ambulatory Visit: Payer: Self-pay

## 2024-06-02 VITALS — BP 130/72

## 2024-06-02 DIAGNOSIS — Z0130 Encounter for examination of blood pressure without abnormal findings: Secondary | ICD-10-CM

## 2024-06-02 NOTE — Progress Notes (Signed)
 Patient reports that she is taking bp medication consistently.

## 2024-08-21 ENCOUNTER — Other Ambulatory Visit: Payer: Self-pay | Admitting: Internal Medicine

## 2024-11-06 ENCOUNTER — Ambulatory Visit: Payer: Self-pay | Admitting: Internal Medicine

## 2024-12-01 ENCOUNTER — Telehealth: Payer: Self-pay | Admitting: Internal Medicine

## 2024-12-01 NOTE — Telephone Encounter (Signed)
 Patient would like to be seen for right side  Chest pain and head ache . Symptoms started three days ago.  Patient has taken ibuprofen , patient states medication helps a little but pain does not go away completely.   We will call patient when have a cancellation,  Patient is available Wednesday any time .

## 2024-12-01 NOTE — Telephone Encounter (Signed)
 Patient has been notified that she needs to go to the [place] emergency room, and is aware that she should apply for financial assistance when receives a bill.  Notify patient we have applications in office.   Patient verbally agree

## 2024-12-01 NOTE — Telephone Encounter (Signed)
 Per Dr. Adella. This is an emergency. She needs to go the  [facility] Emergency room. That she is not insured she can apply for financial assistance once she receives bill

## 2024-12-04 ENCOUNTER — Other Ambulatory Visit: Payer: Self-pay

## 2024-12-04 ENCOUNTER — Emergency Department (HOSPITAL_COMMUNITY)
Admission: EM | Admit: 2024-12-04 | Discharge: 2024-12-04 | Disposition: A | Discharge status: Home / Self Care | Attending: Emergency Medicine | Admitting: Emergency Medicine

## 2024-12-04 ENCOUNTER — Encounter (HOSPITAL_COMMUNITY): Payer: Self-pay | Admitting: Emergency Medicine

## 2024-12-04 ENCOUNTER — Emergency Department (HOSPITAL_COMMUNITY)

## 2024-12-04 DIAGNOSIS — M25511 Pain in right shoulder: Secondary | ICD-10-CM | POA: Insufficient documentation

## 2024-12-04 DIAGNOSIS — J9859 Other diseases of mediastinum, not elsewhere classified: Secondary | ICD-10-CM

## 2024-12-04 DIAGNOSIS — R079 Chest pain, unspecified: Secondary | ICD-10-CM | POA: Insufficient documentation

## 2024-12-04 LAB — CBC
HCT: 39.2 % (ref 36.0–46.0)
Hemoglobin: 13.5 g/dL (ref 12.0–15.0)
MCH: 32.1 pg (ref 26.0–34.0)
MCHC: 34.4 g/dL (ref 30.0–36.0)
MCV: 93.3 fL (ref 80.0–100.0)
Platelets: 263 10*3/uL (ref 150–400)
RBC: 4.2 MIL/uL (ref 3.87–5.11)
RDW: 12.5 % (ref 11.5–15.5)
WBC: 11.3 10*3/uL — ABNORMAL HIGH (ref 4.0–10.5)
nRBC: 0 % (ref 0.0–0.2)

## 2024-12-04 LAB — BASIC METABOLIC PANEL WITH GFR
Anion gap: 11 (ref 5–15)
BUN: 8 mg/dL (ref 6–20)
CO2: 23 mmol/L (ref 22–32)
Calcium: 9 mg/dL (ref 8.9–10.3)
Chloride: 108 mmol/L (ref 98–111)
Creatinine, Ser: 0.62 mg/dL (ref 0.44–1.00)
GFR, Estimated: >60 mL/min
Glucose, Bld: 100 mg/dL — ABNORMAL HIGH (ref 70–99)
Potassium: 3.9 mmol/L (ref 3.5–5.1)
Sodium: 142 mmol/L (ref 135–145)

## 2024-12-04 LAB — TROPONIN T, HIGH SENSITIVITY: Troponin T High Sensitivity: <6 ng/L (ref 0–19)

## 2024-12-04 MED ORDER — ONDANSETRON HCL 4 MG/2ML IJ SOLN
4.0000 mg | Freq: Once | INTRAMUSCULAR | Status: AC
  Administered 2024-12-04: 4 mg via INTRAVENOUS
  Filled 2024-12-04: qty 2

## 2024-12-04 MED ORDER — IOHEXOL 350 MG/ML SOLN
50.0000 mL | Freq: Once | INTRAVENOUS | Status: AC | PRN
  Administered 2024-12-04: 50 mL via INTRAVENOUS

## 2024-12-04 MED ORDER — MORPHINE SULFATE (PF) 4 MG/ML IV SOLN
6.0000 mg | Freq: Once | INTRAVENOUS | Status: AC
  Administered 2024-12-04: 6 mg via INTRAVENOUS
  Filled 2024-12-04: qty 2

## 2024-12-04 MED ORDER — MORPHINE SULFATE (PF) 4 MG/ML IV SOLN
4.0000 mg | Freq: Once | INTRAVENOUS | Status: AC
  Administered 2024-12-04: 4 mg via INTRAVENOUS
  Filled 2024-12-04: qty 1

## 2024-12-04 NOTE — Discharge Instructions (Addendum)
 Call Dr. Lang office today to schedule your follow-up appointment for this Friday

## 2024-12-04 NOTE — ED Notes (Signed)
"  Pt and husband given discharge instructions. Verbalized understanding.  "

## 2024-12-04 NOTE — ED Provider Notes (Signed)
 " [place] EMERGENCY DEPARTMENT AT  HOSPITAL Provider Note   CSN: [id number] Arrival date & time: 12/04/24  9265     Patient presents with: Chest Pain   Courtney Daugherty is a 54 y.o. female.   54 year old female presents with constant right-sided chest pain for about a week.  Pain is worse with certain movements.  Starts in her right anterior chest and goes to her back.  Pain is characterized as sharp and worse with any movements.  No fever cough congestion.  No nausea or vomiting or diaphoresis with it.  States that the pain does somewhat feel pleuritic but denies any leg pain or history of DVT did initially have some headache but that is since resolved.  Her headache was on the right side and characterizes dull and resolved spontaneously.  Denies any neurological features with that.  Has been using ibuprofen with limited relief.  Called her doctor and was told to come here.  Offered to use video interpreter but patient preferred that her husband translate       Prior to Admission medications  Medication Sig Start Date End Date Taking? Authorizing Provider  ascorbic acid (VITAMIN C) 500 MG tablet Take 500 mg by mouth daily.    [provider]  calcium carbonate (CALCIUM 600) 600 MG TABS tablet Take 600 mg by mouth 2 (two) times daily with a meal.    [provider]  Cholecalciferol (VITAMIN D3) 1000 units CAPS Take 1,000 Units by mouth daily.    [provider]  ibuprofen (ADVIL) 600 MG tablet Take 600 mg by mouth every 6 (six) hours as needed for headache.    [provider]  lisinopril  (ZESTRIL ) 10 MG tablet Take 1 tablet (10 mg total) by mouth daily. 02/22/24   Adella Norris, MD  metoprolol  succinate (TOPROL -XL) 25 MG 24 hr tablet Take 1 tablet (25 mg total) by mouth daily. 05/03/24   Adella Norris, MD  Multiple Vitamin (MULTIVITAMIN PO) Take 1 tablet by mouth daily with breakfast.    [provider]     Allergies: Patient has no known allergies.    Review of Systems  All other systems reviewed and are negative.   Updated Vital Signs BP (!) 158/77   Pulse 83   Temp 98.1 F (36.7 C)   Resp 14   LMP 02/16/2021 Comment: Periods were always regular and every month prior to April, even with IUD in place.  SpO2 98%   Physical Exam Vitals and nursing note reviewed.  Constitutional:      General: She is not in acute distress.    Appearance: Normal appearance. She is well-developed. She is not toxic-appearing.  HENT:     Head: Normocephalic and atraumatic.  Eyes:     General: Lids are normal.     Conjunctiva/sclera: Conjunctivae normal.     Pupils: Pupils are equal, round, and reactive to light.  Neck:     Thyroid: No thyroid mass.     Trachea: No tracheal deviation.  Cardiovascular:     Rate and Rhythm: Normal rate and regular rhythm.     Heart sounds: Normal heart sounds. No murmur heard.    No gallop.  Pulmonary:     Effort: Pulmonary effort is normal. No respiratory distress.     Breath sounds: Normal breath sounds. No stridor. No decreased breath sounds, wheezing, rhonchi or rales.  Chest:    Abdominal:     General: There is no distension.  Palpations: Abdomen is soft.     Tenderness: There is no abdominal tenderness. There is no rebound.  Musculoskeletal:        General: No tenderness. Normal range of motion.     Cervical back: Normal range of motion and neck supple.       Back:  Skin:    General: Skin is warm and dry.     Findings: No abrasion or rash.  Neurological:     Mental Status: She is alert and oriented to person, place, and time. Mental status is at baseline.     GCS: GCS eye subscore is 4. GCS verbal subscore is 5. GCS motor subscore is 6.     Cranial Nerves: No cranial nerve deficit.     Sensory: No sensory deficit.     Motor: Motor function is intact.  Psychiatric:        Attention and Perception: Attention normal.        Speech: Speech  normal.        Behavior: Behavior normal.     (all labs ordered are listed, but only abnormal results are displayed) Labs Reviewed  CBC - Abnormal; Notable for the following components:      Result Value   WBC 11.3 (*)    All other components within normal limits  BASIC METABOLIC PANEL WITH GFR  TROPONIN T, HIGH SENSITIVITY    EKG: EKG Interpretation Date/Time:  Monday December 04 2024 07:42:27 EST Ventricular Rate:  83 PR Interval:  142 QRS Duration:  72 QT Interval:  378 QTC Calculation: 444 R Axis:   86  Text Interpretation: Normal sinus rhythm Nonspecific ST abnormality Abnormal ECG No previous ECGs available Confirmed by Dasie Faden ([id number]) on 12/04/2024 8:22:03 AM  Radiology: DG Chest 2 View Result Date: 12/04/2024 EXAM: 2 VIEW(S) XRAY OF THE CHEST 12/04/2024 08:06:00 AM COMPARISON: None available. CLINICAL HISTORY: Chest pain. FINDINGS: LUNGS AND PLEURA: No focal pulmonary opacity. No pleural effusion. No pneumothorax. HEART AND MEDIASTINUM: Rounded density in right paratracheal region with mass effect upon the posterior wall of the trachea, possibly representing mass or lymphadenopathy. Differential diagnosis includes mass or lymphadenopathy. Follow-up imaging with CT chest is recommended for further characterization. BONES AND SOFT TISSUES: No acute osseous abnormality. IMPRESSION: 1. 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 Electronically signed by: Waddell Calk MD 12/04/2024 08:13 AM EST RP Workstation: [id number]     Procedures   Medications Ordered in the ED  morphine  (PF) 4 MG/ML injection 4 mg (has no  administration in time range)                                    Medical Decision Making Amount and/or Complexity of Data Reviewed Labs: ordered. Radiology: ordered.  Risk Prescription drug management.  Patient is EKG shows normal sinus rhythm.  No signs of acute coronary ischemia.  Low suspicion for ACS.  Troponin negative. Patient's labs are without significant abnormality.  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  Consult to thoracic surgery by Dr. Shyrl was done.  He saw the patient  and gave recommendations.     Final diagnoses:  None    ED Discharge Orders     None          Dasie Faden, MD 12/04/24 1116  "

## 2024-12-04 NOTE — ED Triage Notes (Signed)
 Patient arrives for eval of constant R sided chest pain since last Tuesday. Barely relieved by ibuprofen, worse when lying down. Spoke to PCP who advised to come to the ED. Patient describes that the pain started in her head and radiates down her neck to where she can feel it when she swallows, then into her R chest and through to her back. Endorses pain with deep breath but no SOB.

## 2024-12-04 NOTE — Progress Notes (Signed)
" °   °  [address] [place] [address]             [phone]       Images reviewed and case discussed with patient. This will need to be worked up as an outpatient. She can follow-up with me later this week.  Jameon Deller O Tullio Chausse   "

## 2024-12-07 NOTE — Progress Notes (Signed)
 "     [address] [place] [address]             [phone]                   [facility] Medical Record #[id number] Date of Birth: 04-19-1971  Referring: Adella Norris, MD Primary Care: Adella Norris, MD Primary Cardiologist: None  Chief Complaint:    Chief Complaint  Patient presents with   Mediastinal Mass    History of Present Illness:    [facility] 54 y.o. female originally seen in the ED for chest pain.  Cross section imaging reviewed a moderate sized mediastinal mass.  On further questioning, she has had a chronic cough for several decades.  She recently has started to develop some dysphagia.  Her pain is much improved compared to when she originally presented to the emergency.    Past Medical History:  Diagnosis Date   Hypertension     No past surgical history on file.  Family History  Problem Relation Age of Onset   Diabetes Mother        complication of what sounds like BKA   Hypertension Mother    Heart disease Father    Heart attack Father    Breast cancer Neg Hx      Tobacco Use History[1]  Social History   Substance and Sexual Activity  Alcohol Use Yes   Comment: rare     Allergies[2]  Current Outpatient Medications  Medication Sig Dispense Refill   ascorbic acid (VITAMIN C) 500 MG tablet Take 500 mg by mouth daily.     calcium carbonate (CALCIUM 600) 600 MG TABS tablet Take 600 mg by mouth 2 (two) times daily with a meal.     Cholecalciferol (VITAMIN D3) 1000 units CAPS Take 1,000 Units by mouth daily.     ibuprofen (ADVIL) 600 MG tablet Take 600 mg by mouth every 6 (six) hours as needed for headache.     metoprolol  succinate (TOPROL -XL) 25 MG 24 hr tablet Take 1 tablet (25 mg total) by mouth daily. 30 tablet 11   Multiple Vitamin (MULTIVITAMIN PO) Take 1 tablet by mouth daily with breakfast.     lisinopril  (ZESTRIL ) 10 MG tablet Take 1 tablet (10 mg total) by  mouth daily. 30 tablet 11   No current facility-administered medications for this visit.    Review of Systems  Constitutional:  Negative for malaise/fatigue.  Respiratory:  Positive for shortness of breath.   Cardiovascular:  Positive for chest pain.  Gastrointestinal:  Negative for heartburn.  Neurological: Negative.     PHYSICAL EXAMINATION: BP (!) 165/78 (BP Location: Left Arm)   Pulse 88   Resp 18   Ht 5' 2 (1.575 m)   Wt 129 lb (58.5 kg)   LMP 02/16/2021 Comment: Periods were always regular and every month prior to April, even with IUD in place.  SpO2 98%   BMI 23.59 kg/m   Physical Exam Constitutional:      General: She is not in acute distress.    Appearance: She is not ill-appearing.  HENT:     Head: Normocephalic and atraumatic.  Eyes:     Extraocular Movements: Extraocular movements intact.  Cardiovascular:     Rate and Rhythm: Normal rate.  Pulmonary:     Effort: Pulmonary effort is normal. No respiratory distress.  Abdominal:     General: Abdomen is flat.  There is no distension.  Musculoskeletal:        General: Normal range of motion.     Cervical back: Normal range of motion.  Skin:    General: Skin is warm and dry.  Neurological:     General: No focal deficit present.     Mental Status: She is alert and oriented to person, place, and time.      Diagnostic Studies & Laboratory data:     Recent Radiology Findings:   CT Chest W Contrast Result Date: 12/04/2024 CLINICAL DATA:  Right paratracheal mass EXAM: CT CHEST WITH CONTRAST TECHNIQUE: Multidetector CT imaging of the chest was performed during intravenous contrast administration. RADIATION DOSE REDUCTION: This exam was performed according to the departmental dose-optimization program which includes automated exposure control, adjustment of the mA and/or kV according to patient size and/or use of iterative reconstruction technique. CONTRAST:  50mL OMNIPAQUE  IOHEXOL  350 MG/ML SOLN COMPARISON:   Earlier same day chest radiograph FINDINGS: Cardiovascular: Normal heart size. No significant pericardial fluid/thickening. Great vessels are normal in course and caliber. No central pulmonary emboli. Mediastinum/Nodes: Imaged thyroid gland without nodules meeting criteria for imaging follow-up by size. Normal esophagus. No pathologically enlarged axillary, supraclavicular, mediastinal, or hilar lymph nodes. Mildly hypodense mass measuring 38 HU in the posterior midline mediastinum measures 4.3 x 3.2 cm (3:61) and exerts mass effect on the adjacent soft tissue structures with effacement of the airways as described below and left lateral displacement of the esophagus. No internal calcification or erosive changes of the adjacent vertebral bodies. Lungs/Pleura: The central airways are patent. Severe effacement of the lower trachea, carina, and bilateral main bronchi. Mild bilateral lower lobe mosaic attenuation. No pneumothorax. No pleural effusion. Upper abdomen: 7 mm hypodensity in the central liver adjacent to the IVC (3:145). Musculoskeletal: No acute or abnormal lytic or blastic osseous lesions. IMPRESSION: 1. Mildly hypodense mass in the posterior midline mediastinum measures 4.3 x 3.2 cm and exerts mass effect on the adjacent soft tissue structures with effacement of the airways. No internal calcification or erosive changes of the adjacent vertebral bodies. Differential includes foregut duplication cyst, favoring esophageal duplication cyst containing internal hemorrhage/proteinaceous debris, or less likely neoplasm. 2. Severe effacement of the lower trachea, carina, and bilateral main bronchi with mild bilateral lower lobe mosaic attenuation, which may be secondary to the airway effacement. 3. A 7 mm hypodensity in the central liver adjacent to the IVC is too small to characterize but likely represents a cyst. Attention on follow-up. Electronically Signed   By: Limin  Xu M.D.   On: 12/04/2024 09:35   DG Chest  2 View Result Date: 12/04/2024 EXAM: 2 VIEW(S) XRAY OF THE CHEST 12/04/2024 08:06:00 AM COMPARISON: None available. CLINICAL HISTORY: Chest pain. FINDINGS: LUNGS AND PLEURA: No focal pulmonary opacity. No pleural effusion. No pneumothorax. HEART AND MEDIASTINUM: Rounded density in right paratracheal region with mass effect upon the posterior wall of the trachea, possibly representing mass or lymphadenopathy. Differential diagnosis includes mass or lymphadenopathy. Follow-up imaging with CT chest is recommended for further characterization. BONES AND SOFT TISSUES: No acute osseous abnormality. IMPRESSION: 1. 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 Electronically signed by: Waddell Calk MD 12/04/2024 08:13 AM EST RP Workstation: [id number]  I have independently reviewed the above radiology studies  and reviewed the findings with the patient.   Recent Lab Findings: Lab Results  Component Value Date   WBC 11.3 (H) 12/04/2024   HGB 13.5 12/04/2024   HCT 39.2 12/04/2024   PLT 263 12/04/2024   GLUCOSE 100 (H) 12/04/2024   CHOL 176 05/03/2024   TRIG 79 05/03/2024   HDL 64 05/03/2024   LDLCALC 97 05/03/2024   ALT 17 04/30/2023   AST 24 04/30/2023   NA 142 12/04/2024   K 3.9 12/04/2024   CL 108 12/04/2024   CREATININE 0.62 12/04/2024   BUN 8 12/04/2024   CO2 23 12/04/2024   HGBA1C 5.2 05/03/2024       Assessment / Plan:   53yo female with middle mediastinal mass.  Question duplication cyst.  Will need EGD and Bronchoscopy.  I have also ordered a chest MRI as well as tumor markers.  I will follow-up with her extensive  results.  If she elects to proceed with surgery we will then perform a bronchoscopy at the same time as her resection.      Courtney [facility] 12/08/2024 4:07 PM           [1]  Social History Tobacco Use  Smoking Status Never   Passive exposure: Never  Smokeless Tobacco Never  [2] No Known Allergies  "

## 2024-12-08 ENCOUNTER — Other Ambulatory Visit: Payer: Self-pay | Admitting: Thoracic Surgery (Cardiothoracic Vascular Surgery)

## 2024-12-08 ENCOUNTER — Encounter: Admitting: Thoracic Surgery (Cardiothoracic Vascular Surgery)

## 2024-12-08 ENCOUNTER — Ambulatory Visit
Payer: Self-pay | Attending: Thoracic Surgery (Cardiothoracic Vascular Surgery) | Admitting: Thoracic Surgery (Cardiothoracic Vascular Surgery)

## 2024-12-08 VITALS — BP 165/78 | HR 88 | Resp 18 | Ht 62.0 in | Wt 129.0 lb

## 2024-12-08 DIAGNOSIS — J9859 Other diseases of mediastinum, not elsewhere classified: Secondary | ICD-10-CM

## 2025-01-10 ENCOUNTER — Other Ambulatory Visit

## 2025-01-18 ENCOUNTER — Ambulatory Visit: Payer: Self-pay | Admitting: Internal Medicine

## 2025-01-19 ENCOUNTER — Telehealth: Admitting: Thoracic Surgery (Cardiothoracic Vascular Surgery)
